# Patient Record
Sex: Female | Born: 1955 | Race: White | Hispanic: No | Marital: Married | Smoking: Current every day smoker
Health system: Southern US, Community
[De-identification: ages and names within clinical notes are randomized; demographics above are authoritative.]

## PROBLEM LIST (undated history)

## (undated) DIAGNOSIS — R059 Cough, unspecified: Secondary | ICD-10-CM

## (undated) DIAGNOSIS — F17200 Nicotine dependence, unspecified, uncomplicated: Secondary | ICD-10-CM

## (undated) DIAGNOSIS — R05 Cough: Secondary | ICD-10-CM

## (undated) DIAGNOSIS — J42 Unspecified chronic bronchitis: Secondary | ICD-10-CM

## (undated) HISTORY — DX: Cough: R05

## (undated) HISTORY — PX: ABDOMINAL HYSTERECTOMY: SHX81

## (undated) HISTORY — DX: Unspecified chronic bronchitis: J42

## (undated) HISTORY — DX: Cough, unspecified: R05.9

## (undated) HISTORY — DX: Nicotine dependence, unspecified, uncomplicated: F17.200

---

## 2002-02-13 HISTORY — PX: CARPAL TUNNEL RELEASE: SHX101

## 2004-02-14 HISTORY — PX: TENNIS ELBOW RELEASE/NIRSCHEL PROCEDURE: SHX6651

## 2009-10-27 ENCOUNTER — Encounter: Payer: Self-pay | Admitting: Otolaryngology

## 2009-11-13 ENCOUNTER — Encounter: Payer: Self-pay | Admitting: Otolaryngology

## 2017-12-20 ENCOUNTER — Ambulatory Visit: Payer: Self-pay | Admitting: Internal Medicine

## 2017-12-25 ENCOUNTER — Ambulatory Visit: Payer: BLUE CROSS/BLUE SHIELD | Admitting: Internal Medicine

## 2017-12-25 ENCOUNTER — Ambulatory Visit
Admission: RE | Admit: 2017-12-25 | Discharge: 2017-12-25 | Disposition: A | Discharge status: Home / Self Care | Payer: BLUE CROSS/BLUE SHIELD | Source: Ambulatory Visit | Attending: Internal Medicine | Admitting: Internal Medicine

## 2017-12-25 ENCOUNTER — Encounter: Payer: Self-pay | Admitting: Internal Medicine

## 2017-12-25 VITALS — BP 108/72 | HR 74 | Resp 16 | Ht 61.0 in | Wt 105.0 lb

## 2017-12-25 DIAGNOSIS — J301 Allergic rhinitis due to pollen: Secondary | ICD-10-CM

## 2017-12-25 DIAGNOSIS — F17200 Nicotine dependence, unspecified, uncomplicated: Secondary | ICD-10-CM | POA: Diagnosis not present

## 2017-12-25 DIAGNOSIS — J449 Chronic obstructive pulmonary disease, unspecified: Secondary | ICD-10-CM | POA: Insufficient documentation

## 2017-12-25 DIAGNOSIS — R059 Cough, unspecified: Secondary | ICD-10-CM

## 2017-12-25 DIAGNOSIS — R0602 Shortness of breath: Secondary | ICD-10-CM

## 2017-12-25 DIAGNOSIS — R05 Cough: Secondary | ICD-10-CM

## 2017-12-25 NOTE — Patient Instructions (Signed)

## 2017-12-25 NOTE — Progress Notes (Signed)
[facility] [address] [place], [place] [address]  Pulmonary Sleep Medicine   Office Visit Note  Patient Name: Alice Sanders DOB: 1955/10/04 MRN [id number]  Date of Service: 12/25/2017  Complaints/HPI: Patient is here as a new patient patient been having cough congestion.  Patient is a smoker and has been a lifelong smoker.  She states she gets shortness of breath on exertion.  She has some cough and congestion.  She brings up sputum no hemoptysis is noted.  Patient denies any chest pain no palpitations.  She states that she has never been diagnosed previously with a pneumonia.  States that she has had evaluation at her primary care with x-rays without any significant findings.  Patient has been placed on trilogy and this is helped her to some extent.  She also admits to having allergies lifelong.  States that she has had symptoms of rhinitis congestion.  She was tested in the past but never placed on allergy shots.  Also she states that she has not had any changes in those allergy symptoms.  She has been using Singulair which does help to some extent but does not completely fix her problem.  ROS  General: (-) fever, (-) chills, (-) night sweats, (-) weakness Skin: (-) rashes, (-) itching,. Eyes: (-) visual changes, (-) redness, (-) itching. Nose and Sinuses: (-) nasal stuffiness or itchiness, (-) postnasal drip, (-) nosebleeds, (-) sinus trouble. Mouth and Throat: (-) sore throat, (-) hoarseness. Neck: (-) swollen glands, (-) enlarged thyroid, (-) neck pain. Respiratory: + cough, (-) bloody sputum, + shortness of breath, + wheezing. Cardiovascular: - ankle swelling, (-) chest pain. Lymphatic: (-) lymph node enlargement. Neurologic: (-) numbness, (-) tingling. Psychiatric: (-) anxiety, (-) depression   Current Medication: Outpatient Encounter Medications as of 12/25/2017  Medication Sig  . aspirin EC 81 MG tablet Take 81 mg by mouth daily.  . ergocalciferol  (VITAMIN D2) 1.25 MG (50000 UT) capsule Take 50,000 Units by mouth once a week.  . estradiol-norethindrone (ACTIVELLA) 1-0.5 MG tablet   . Fluticasone-Umeclidin-Vilant (TRELEGY ELLIPTA) 100-62.5-25 MCG/INH AEPB Inhale into the lungs.  Marland Kitchen levothyroxine (SYNTHROID, LEVOTHROID) 25 MCG tablet Take 25 mcg by mouth daily before breakfast.  . metoprolol succinate (TOPROL-XL) 50 MG 24 hr tablet Take 50 mg by mouth daily. Take with or immediately following a meal.  . montelukast (SINGULAIR) 10 MG tablet Take 10 mg by mouth at bedtime.  . simvastatin (ZOCOR) 20 MG tablet Take 20 mg by mouth daily.  Terald Sleeper HFA 108 (90 Base) MCG/ACT inhaler    No facility-administered encounter medications on file as of 12/25/2017.     Surgical History: Past Surgical History:  Procedure Laterality Date  . ABDOMINAL HYSTERECTOMY    . CARPAL TUNNEL RELEASE  2004  . TENNIS ELBOW RELEASE/NIRSCHEL PROCEDURE  2006    Medical History: Past Medical History:  Diagnosis Date  . Chronic bronchitis (HCC)     Family History: Family History  Problem Relation Age of Onset  . Hypertension Mother   . Hyperlipidemia Mother   . Hypertension Father   . Hyperlipidemia Father   . Hypertension Brother   . Hyperlipidemia Brother     Social History: Social History   Socioeconomic History  . Marital status: Married    Spouse name: Not on file  . Number of children: Not on file  . Years of education: Not on file  . Highest education level: Not on file  Occupational History  . Not on file  Social Needs  . Financial resource strain: Not on file  . Food insecurity:    Worry: Not on file    Inability: Not on file  . Transportation needs:    Medical: Not on file    Non-medical: Not on file  Tobacco Use  . Smoking status: Current Some Day Smoker    Packs/day: 0.50    Types: Cigarettes  . Smokeless tobacco: Never Used  Substance and Sexual Activity  . Alcohol use: Never    Frequency: Never  . Drug use: Never  .  Sexual activity: Not on file  Lifestyle  . Physical activity:    Days per week: Not on file    Minutes per session: Not on file  . Stress: Not on file  Relationships  . Social connections:    Talks on phone: Not on file    Gets together: Not on file    Attends religious service: Not on file    Active member of club or organization: Not on file    Attends meetings of clubs or organizations: Not on file    Relationship status: Not on file  . Intimate partner violence:    Fear of current or ex partner: Not on file    Emotionally abused: Not on file    Physically abused: Not on file    Forced sexual activity: Not on file  Other Topics Concern  . Not on file  Social History Narrative  . Not on file    Vital Signs: Blood pressure 108/72, pulse 74, resp. rate 16, height 5\' 1"  (1.549 m), weight 105 lb (47.6 kg), SpO2 98 %.  Examination: General Appearance: The patient is well-developed, well-nourished, and in no distress. Skin: Gross inspection of skin unremarkable. Head: normocephalic, no gross deformities. Eyes: no gross deformities noted. ENT: ears appear grossly normal no exudates. Neck: Supple. No thyromegaly. No LAD. Respiratory: no rhonhi noted at this time. Cardiovascular: Normal S1 and S2 without murmur or rub. Extremities: No cyanosis. pulses are equal. Neurologic: Alert and oriented. No involuntary movements.  LABS: No results found for this or any previous visit (from the past 2160 hour(s)).  Radiology: No results found.  No results found.  No results found.    Assessment and Plan: Patient Active Problem List   Diagnosis Date Noted  . Cough   . Smoker     1. SOB/Cough likely evaluation of ongoing tobacco use as well as allergic rhinitis.  I would recommend at this time that she stop smoking so smoking cessation counseling was provided to her.  She also will be scheduled for evaluation of hospital COPD and so therefore we will schedule for a pulmonary  function study. 2. Allergic Rhinitis scheduled for allergy test I think she would benefit from allergy shots.  She has had allergies to pollen in the past she states 3. Smoker smoking cessation counseling was provided at this time.  Patient understands the risks are involved however she feels like she is not ready to quit smoking at this time. 4. Shortness of breath as above likely combination of cigarette smoking COPD.  Once again smoking cessation counseling was provided to patient.   General Counseling: I have discussed the findings of the evaluation and examination with Velna Hatchet.  I have also discussed any further diagnostic evaluation thatmay be needed or ordered today. Kassiah verbalizes understanding of the findings of todays visit. We also reviewed her medications today and discussed drug interactions and side effects including but not limited excessive  drowsiness and altered mental states. We also discussed that there is always a risk not just to her but also people around her. she has been encouraged to call the office with any questions or concerns that should arise related to todays visit.    Time spent: 45min  I have personally obtained a history, examined the patient, evaluated laboratory and imaging results, formulated the assessment and plan and placed orders.    Yevonne PaxSaadat A , MD [facility] Pulmonary and Critical Care Sleep medicine

## 2018-01-09 ENCOUNTER — Ambulatory Visit: Payer: BLUE CROSS/BLUE SHIELD | Admitting: Internal Medicine

## 2018-01-09 DIAGNOSIS — R0602 Shortness of breath: Secondary | ICD-10-CM

## 2018-01-09 LAB — PULMONARY FUNCTION TEST

## 2018-01-12 NOTE — Procedures (Signed)
[facility] [address] [place], [id number]  DATE OF SERVICE: January 09, 2018  Complete Pulmonary Function Testing Interpretation:  FINDINGS:  Forced vital capacity is normal the FEV1 is 1.53 L which is 71% of predicted and is mildly decreased.  FEV1 FVC ratio is moderately decreased.  Postbronchodilator there is no significant change in the FEV1 however clinical improvement may occur in the absence of spirometric improvement.  Total lung capacity is increased by plethysmography.  Residual volume is increased residual volume total lung capacity ratio is increased FRC is increased.  DLCO is severely decreased.  IMPRESSION:  This pulmonary function study is consistent with mild obstructive lung disease.  No improvement after bronchodilator.  DLCO severely decreased  Yevonne PaxSaadat A Khan, MD [facility] Pulmonary Critical Care Medicine Sleep Medicine

## 2018-01-22 ENCOUNTER — Ambulatory Visit: Payer: BLUE CROSS/BLUE SHIELD | Admitting: Internal Medicine

## 2018-01-22 ENCOUNTER — Encounter: Payer: Self-pay | Admitting: Internal Medicine

## 2018-01-22 VITALS — BP 98/62 | HR 75 | Resp 16 | Ht 61.0 in | Wt 108.0 lb

## 2018-01-22 DIAGNOSIS — F17219 Nicotine dependence, cigarettes, with unspecified nicotine-induced disorders: Secondary | ICD-10-CM | POA: Diagnosis not present

## 2018-01-22 DIAGNOSIS — J449 Chronic obstructive pulmonary disease, unspecified: Secondary | ICD-10-CM

## 2018-01-22 DIAGNOSIS — R059 Cough, unspecified: Secondary | ICD-10-CM

## 2018-01-22 DIAGNOSIS — R05 Cough: Secondary | ICD-10-CM | POA: Diagnosis not present

## 2018-01-22 DIAGNOSIS — R0602 Shortness of breath: Secondary | ICD-10-CM | POA: Diagnosis not present

## 2018-01-22 NOTE — Patient Instructions (Signed)

## 2018-01-22 NOTE — Progress Notes (Signed)
[facility] [address] [place], [place] [address]  Pulmonary Sleep Medicine   Office Visit Note  Patient Name: Alice Sanders DOB: Jul 31, 1955 MRN [id number]  Date of Service: 01/22/2018  Complaints/HPI: Pt is here for follow up on PFT and CXR.  Patient's PFT reveals an FEV1 of 1.53 L.  Which is consistent with mild obstructive lung disease.  Patient generally is doing well and denies any overt shortness of breath.  Unfortunately she does continue to smoke approximately a half a pack of cigarettes per day.  She denies any other issues currently.  ROS  General: (-) fever, (-) chills, (-) night sweats, (-) weakness Skin: (-) rashes, (-) itching,. Eyes: (-) visual changes, (-) redness, (-) itching. Nose and Sinuses: (-) nasal stuffiness or itchiness, (-) postnasal drip, (-) nosebleeds, (-) sinus trouble. Mouth and Throat: (-) sore throat, (-) hoarseness. Neck: (-) swollen glands, (-) enlarged thyroid, (-) neck pain. Respiratory: - cough, (-) bloody sputum, - shortness of breath, - wheezing. Cardiovascular: - ankle swelling, (-) chest pain. Lymphatic: (-) lymph node enlargement. Neurologic: (-) numbness, (-) tingling. Psychiatric: (-) anxiety, (-) depression   Current Medication: Outpatient Encounter Medications as of 01/22/2018  Medication Sig  . aspirin EC 81 MG tablet Take 81 mg by mouth daily.  . ergocalciferol (VITAMIN D2) 1.25 MG (50000 UT) capsule Take 50,000 Units by mouth once a week.  . estradiol-norethindrone (ACTIVELLA) 1-0.5 MG tablet   . Fluticasone-Umeclidin-Vilant (TRELEGY ELLIPTA) 100-62.5-25 MCG/INH AEPB Inhale into the lungs.  Marland Kitchen levothyroxine (SYNTHROID, LEVOTHROID) 25 MCG tablet Take 25 mcg by mouth daily before breakfast.  . metoprolol succinate (TOPROL-XL) 50 MG 24 hr tablet Take 50 mg by mouth daily. Take with or immediately following a meal.  . montelukast (SINGULAIR) 10 MG tablet Take 10 mg by mouth at bedtime.  . simvastatin (ZOCOR) 20 MG  tablet Take 20 mg by mouth daily.  Terald Sleeper HFA 108 (90 Base) MCG/ACT inhaler    No facility-administered encounter medications on file as of 01/22/2018.     Surgical History: Past Surgical History:  Procedure Laterality Date  . ABDOMINAL HYSTERECTOMY    . CARPAL TUNNEL RELEASE  2004  . TENNIS ELBOW RELEASE/NIRSCHEL PROCEDURE  2006    Medical History: Past Medical History:  Diagnosis Date  . Chronic bronchitis (HCC)   . Cough   . Smoker     Family History: Family History  Problem Relation Age of Onset  . Hypertension Mother   . Hyperlipidemia Mother   . Hypertension Father   . Hyperlipidemia Father   . Hypertension Brother   . Hyperlipidemia Brother     Social History: Social History   Socioeconomic History  . Marital status: Married    Spouse name: Not on file  . Number of children: Not on file  . Years of education: Not on file  . Highest education level: Not on file  Occupational History  . Not on file  Social Needs  . Financial resource strain: Not on file  . Food insecurity:    Worry: Not on file    Inability: Not on file  . Transportation needs:    Medical: Not on file    Non-medical: Not on file  Tobacco Use  . Smoking status: Current Some Day Smoker    Packs/day: 0.50    Types: Cigarettes  . Smokeless tobacco: Never Used  Substance and Sexual Activity  . Alcohol use: Never    Frequency: Never  . Drug use: Never  .  Sexual activity: Not on file  Lifestyle  . Physical activity:    Days per week: Not on file    Minutes per session: Not on file  . Stress: Not on file  Relationships  . Social connections:    Talks on phone: Not on file    Gets together: Not on file    Attends religious service: Not on file    Active member of club or organization: Not on file    Attends meetings of clubs or organizations: Not on file    Relationship status: Not on file  . Intimate partner violence:    Fear of current or ex partner: Not on file     Emotionally abused: Not on file    Physically abused: Not on file    Forced sexual activity: Not on file  Other Topics Concern  . Not on file  Social History Narrative  . Not on file    Vital Signs: Blood pressure 98/62, pulse 75, resp. rate 16, height 5\' 1"  (1.549 m), weight 108 lb (49 kg), SpO2 98 %.  Examination: General Appearance: The patient is well-developed, well-nourished, and in no distress. Skin: Gross inspection of skin unremarkable. Head: normocephalic, no gross deformities. Eyes: no gross deformities noted. ENT: ears appear grossly normal no exudates. Neck: Supple. No thyromegaly. No LAD. Respiratory: No rhonchi or rales are noted at this time. Cardiovascular: Normal S1 and S2 without murmur or rub. Extremities: No cyanosis. pulses are equal. Neurologic: Alert and oriented. No involuntary movements.  LABS: No results found for this or any previous visit (from the past 2160 hour(s)).  Radiology: Dg Chest 2 View  Result Date: 12/26/2017 CLINICAL DATA:  Cough and shortness of Breath EXAM: CHEST - 2 VIEW COMPARISON:  None. FINDINGS: Cardiac shadows within normal limits. The lungs are hyperinflated consistent with COPD. Some minimal streaky density is noted in the left lower lobe. This could represent some early infiltrate and/or atelectasis. No bony abnormality is noted. IMPRESSION: COPD with mild streaky opacity in the left lower lobe posteriorly. This may represent some early infiltrate. Electronically Signed   By: Alcide CleverMark  Lukens M.D.   On: 12/26/2017 08:20    No results found.  Dg Chest 2 View  Result Date: 12/26/2017 CLINICAL DATA:  Cough and shortness of Breath EXAM: CHEST - 2 VIEW COMPARISON:  None. FINDINGS: Cardiac shadows within normal limits. The lungs are hyperinflated consistent with COPD. Some minimal streaky density is noted in the left lower lobe. This could represent some early infiltrate and/or atelectasis. No bony abnormality is noted. IMPRESSION: COPD  with mild streaky opacity in the left lower lobe posteriorly. This may represent some early infiltrate. Electronically Signed   By: Alcide CleverMark  Lukens M.D.   On: 12/26/2017 08:20      Assessment and Plan: Patient Active Problem List   Diagnosis Date Noted  . Cough   . Smoker    1. Chronic obstructive pulmonary disease, unspecified COPD type (HCC) Stable continue current management.  2. Cigarette nicotine dependence with nicotine-induced disorder Smoking cessation counseling: 1. Pt acknowledges the risks of long term smoking, she will try to quite smoking. 2. Options for different medications including nicotine products, chewing gum, patch etc, Wellbutrin and Chantix is discussed 3. Goal and date of compete cessation is discussed 4. Total time spent in smoking cessation is 15 min.  3. SOB (shortness of breath) Stable at this time  4. Cough Cough appears to have resolved.  Patient denies any other complaints.  General Counseling: I have discussed the findings of the evaluation and examination with Velna Hatchet.  I have also discussed any further diagnostic evaluation thatmay be needed or ordered today. Mallory verbalizes understanding of the findings of todays visit. We also reviewed her medications today and discussed drug interactions and side effects including but not limited excessive drowsiness and altered mental states. We also discussed that there is always a risk not just to her but also people around her. she has been encouraged to call the office with any questions or concerns that should arise related to todays visit.    Time spent: 25 This patient was seen by Blima Ledger AGNP-C in Collaboration with Dr. Freda Munro as a part of collaborative care agreement.   I have personally obtained a history, examined the patient, evaluated laboratory and imaging results, formulated the assessment and plan and placed orders.    Yevonne Pax, MD [facility] Pulmonary and Critical Care Sleep  medicine

## 2018-04-30 ENCOUNTER — Encounter: Payer: Self-pay | Admitting: Internal Medicine

## 2018-04-30 ENCOUNTER — Ambulatory Visit: Payer: BLUE CROSS/BLUE SHIELD | Admitting: Internal Medicine

## 2018-04-30 VITALS — BP 108/72 | HR 66 | Resp 16 | Ht 61.0 in | Wt 102.0 lb

## 2018-04-30 DIAGNOSIS — J449 Chronic obstructive pulmonary disease, unspecified: Secondary | ICD-10-CM

## 2018-04-30 DIAGNOSIS — J301 Allergic rhinitis due to pollen: Secondary | ICD-10-CM | POA: Diagnosis not present

## 2018-04-30 DIAGNOSIS — F17219 Nicotine dependence, cigarettes, with unspecified nicotine-induced disorders: Secondary | ICD-10-CM | POA: Diagnosis not present

## 2018-04-30 DIAGNOSIS — R0602 Shortness of breath: Secondary | ICD-10-CM

## 2018-04-30 MED ORDER — BENZONATATE 100 MG PO CAPS: 100.0000 mg | ORAL_CAPSULE | Freq: Two times a day (BID) | ORAL | 0 refills | Status: DC | PRN

## 2018-04-30 NOTE — Progress Notes (Signed)
[facility] [address] [place], [place] [address]  Pulmonary Sleep Medicine   Office Visit Note  Patient Name: Alice Sanders DOB: 22-Dec-1955 MRN [id number]  Date of Service: 04/30/2018  Complaints/HPI: Pt is here for follow up on copd, Asthma, and allergies. Overall she reports she is doing well.  Denies any new symptoms.  She continues to have chronic cough.  unfortunately she continues to smoke cigarettes.  She reports he cough was very bad two nights ago and she found some old Benzonatate that she was previously prescribed.  She took one, and it helped tremendously with her cough.  She is requesting a refill on that medication at this time.      ROS  General: (-) fever, (-) chills, (-) night sweats, (-) weakness Skin: (-) rashes, (-) itching,. Eyes: (-) visual changes, (-) redness, (-) itching. Nose and Sinuses: (-) nasal stuffiness or itchiness, (-) postnasal drip, (-) nosebleeds, (-) sinus trouble. Mouth and Throat: (-) sore throat, (-) hoarseness. Neck: (-) swollen glands, (-) enlarged thyroid, (-) neck pain. Respiratory: - cough, (-) bloody sputum, - shortness of breath, - wheezing. Cardiovascular: - ankle swelling, (-) chest pain. Lymphatic: (-) lymph node enlargement. Neurologic: (-) numbness, (-) tingling. Psychiatric: (-) anxiety, (-) depression   Current Medication: Outpatient Encounter Medications as of 04/30/2018  Medication Sig  . aspirin EC 81 MG tablet Take 81 mg by mouth daily.  . ergocalciferol (VITAMIN D2) 1.25 MG (50000 UT) capsule Take 50,000 Units by mouth once a week.  . estradiol-norethindrone (ACTIVELLA) 1-0.5 MG tablet   . Fluticasone-Umeclidin-Vilant (TRELEGY ELLIPTA) 100-62.5-25 MCG/INH AEPB Inhale into the lungs.  Marland Kitchen levothyroxine (SYNTHROID, LEVOTHROID) 25 MCG tablet Take 25 mcg by mouth daily before breakfast.  . metoprolol succinate (TOPROL-XL) 50 MG 24 hr tablet Take 50 mg by mouth daily. Take with or immediately following a meal.   . montelukast (SINGULAIR) 10 MG tablet Take 10 mg by mouth at bedtime.  . simvastatin (ZOCOR) 20 MG tablet Take 20 mg by mouth daily.  Terald Sleeper HFA 108 (90 Base) MCG/ACT inhaler    No facility-administered encounter medications on file as of 04/30/2018.     Surgical History: Past Surgical History:  Procedure Laterality Date  . ABDOMINAL HYSTERECTOMY    . CARPAL TUNNEL RELEASE  2004  . TENNIS ELBOW RELEASE/NIRSCHEL PROCEDURE  2006    Medical History: Past Medical History:  Diagnosis Date  . Chronic bronchitis (HCC)   . Cough   . Smoker     Family History: Family History  Problem Relation Age of Onset  . Hypertension Mother   . Hyperlipidemia Mother   . Hypertension Father   . Hyperlipidemia Father   . Hypertension Brother   . Hyperlipidemia Brother     Social History: Social History   Socioeconomic History  . Marital status: Married    Spouse name: Not on file  . Number of children: Not on file  . Years of education: Not on file  . Highest education level: Not on file  Occupational History  . Not on file  Social Needs  . Financial resource strain: Not on file  . Food insecurity:    Worry: Not on file    Inability: Not on file  . Transportation needs:    Medical: Not on file    Non-medical: Not on file  Tobacco Use  . Smoking status: Current Some Day Smoker    Packs/day: 0.50    Types: Cigarettes  . Smokeless tobacco: Never Used  Substance and Sexual Activity  . Alcohol use: Never    Frequency: Never  . Drug use: Never  . Sexual activity: Not on file  Lifestyle  . Physical activity:    Days per week: Not on file    Minutes per session: Not on file  . Stress: Not on file  Relationships  . Social connections:    Talks on phone: Not on file    Gets together: Not on file    Attends religious service: Not on file    Active member of club or organization: Not on file    Attends meetings of clubs or organizations: Not on file    Relationship  status: Not on file  . Intimate partner violence:    Fear of current or ex partner: Not on file    Emotionally abused: Not on file    Physically abused: Not on file    Forced sexual activity: Not on file  Other Topics Concern  . Not on file  Social History Narrative  . Not on file    Vital Signs: Blood pressure 108/72, pulse 66, resp. rate 16, height 5\' 1"  (1.549 m), weight 102 lb (46.3 kg), SpO2 96 %.  Examination: General Appearance: The patient is well-developed, well-nourished, and in no distress. Skin: Gross inspection of skin unremarkable. Head: normocephalic, no gross deformities. Eyes: no gross deformities noted. ENT: ears appear grossly normal no exudates. Neck: Supple. No thyromegaly. No LAD. Respiratory: clear bilateraly. Cardiovascular: Normal S1 and S2 without murmur or rub. Extremities: No cyanosis. pulses are equal. Neurologic: Alert and oriented. No involuntary movements.  LABS: No results found for this or any previous visit (from the past 2160 hour(s)).  Radiology: Dg Chest 2 View  Result Date: 12/26/2017 CLINICAL DATA:  Cough and shortness of Breath EXAM: CHEST - 2 VIEW COMPARISON:  None. FINDINGS: Cardiac shadows within normal limits. The lungs are hyperinflated consistent with COPD. Some minimal streaky density is noted in the left lower lobe. This could represent some early infiltrate and/or atelectasis. No bony abnormality is noted. IMPRESSION: COPD with mild streaky opacity in the left lower lobe posteriorly. This may represent some early infiltrate. Electronically Signed   By: Alcide Clever M.D.   On: 12/26/2017 08:20    No results found.  No results found.    Assessment and Plan: Patient Active Problem List   Diagnosis Date Noted  . Cough   . Smoker     1. Chronic obstructive pulmonary disease, unspecified COPD type (HCC) Stable, continue to use inhalers as prescribed.    2. Cigarette nicotine dependence with nicotine-induced  disorder Smoking cessation counseling: 1. Pt acknowledges the risks of long term smoking, she will try to quite smoking. 2. Options for different medications including nicotine products, chewing gum, patch etc, Wellbutrin and Chantix is discussed 3. Goal and date of compete cessation is discussed 4. Total time spent in smoking cessation is 15 min.   3. Seasonal allergic rhinitis due to pollen PT uses multiple homeopathic medicines   4. SOB (shortness of breath) FVC is 2.3 which is 78% of the predicted value FEV1 is 1.2 which is 52% of the pre-predicted value FEV1/FVC is 51% which is 66% of the pre-predicted value on today spirometry. - Spirometry with Graph  General Counseling: I have discussed the findings of the evaluation and examination with Velna Hatchet.  I have also discussed any further diagnostic evaluation thatmay be needed or ordered today. Sharonda verbalizes understanding of the findings of todays visit. We  also reviewed her medications today and discussed drug interactions and side effects including but not limited excessive drowsiness and altered mental states. We also discussed that there is always a risk not just to her but also people around her. she has been encouraged to call the office with any questions or concerns that should arise related to todays visit.    Time spent: 25 This patient was seen by Blima Ledger AGNP-C in Collaboration with Dr. Freda Munro as a part of collaborative care agreement.   I have personally obtained a history, examined the patient, evaluated laboratory and imaging results, formulated the assessment and plan and placed orders.    Yevonne Pax, MD [facility] Pulmonary and Critical Care Sleep medicine

## 2018-09-26 ENCOUNTER — Ambulatory Visit: Payer: BC Managed Care – PPO | Admitting: Internal Medicine

## 2018-09-26 ENCOUNTER — Encounter: Payer: Self-pay | Admitting: Internal Medicine

## 2018-09-26 ENCOUNTER — Other Ambulatory Visit: Payer: Self-pay

## 2018-09-26 VITALS — BP 98/68 | HR 68 | Resp 16 | Ht 61.0 in | Wt 103.0 lb

## 2018-09-26 DIAGNOSIS — R0602 Shortness of breath: Secondary | ICD-10-CM

## 2018-09-26 DIAGNOSIS — R05 Cough: Secondary | ICD-10-CM | POA: Diagnosis not present

## 2018-09-26 DIAGNOSIS — R059 Cough, unspecified: Secondary | ICD-10-CM

## 2018-09-26 DIAGNOSIS — J301 Allergic rhinitis due to pollen: Secondary | ICD-10-CM

## 2018-09-26 DIAGNOSIS — F17219 Nicotine dependence, cigarettes, with unspecified nicotine-induced disorders: Secondary | ICD-10-CM

## 2018-09-26 DIAGNOSIS — J449 Chronic obstructive pulmonary disease, unspecified: Secondary | ICD-10-CM

## 2018-09-26 NOTE — Progress Notes (Signed)
[facility] [place], [place] [address]  Pulmonary Sleep Medicine   Office Visit Note  Patient Name: Alice Sanders DOB: [birth year] MRN [id number]  Date of Service: 09/26/2018  Complaints/HPI: Pt is here for follow up on copd, asthma and allergies.  She reports she is doing well. She reports she has been using benzonatate for cough as needed and reports excellent results.  Unfortunately she continues to smoke 1/2 PPD. She denies any new or worsening sympotms.  She is working in a Special educational needs teacher.  She is using trelegy inhaler with good results.  She has a rescue inhaler ventolin, but has not needed it often.      ROS  General: (-) fever, (-) chills, (-) night sweats, (-) weakness Skin: (-) rashes, (-) itching,. Eyes: (-) visual changes, (-) redness, (-) itching. Nose and Sinuses: (-) nasal stuffiness or itchiness, (-) postnasal drip, (-) nosebleeds, (-) sinus trouble. Mouth and Throat: (-) sore throat, (-) hoarseness. Neck: (-) swollen glands, (-) enlarged thyroid, (-) neck pain. Respiratory: - cough, (-) bloody sputum, - shortness of breath, - wheezing. Cardiovascular: - ankle swelling, (-) chest pain. Lymphatic: (-) lymph node enlargement. Neurologic: (-) numbness, (-) tingling. Psychiatric: (-) anxiety, (-) depression   Current Medication: Outpatient Encounter Medications as of 09/26/2018  Medication Sig  . aspirin EC 81 MG tablet Take 81 mg by mouth daily.  . benzonatate (TESSALON) 100 MG capsule Take 1-2 capsules (100-200 mg total) by mouth 2 (two) times daily as needed for cough.  . ergocalciferol (VITAMIN D2) 1.25 MG (50000 UT) capsule Take 50,000 Units by mouth once a week.  . estradiol-norethindrone (ACTIVELLA) 1-0.5 MG tablet   . Fluticasone-Umeclidin-Vilant (TRELEGY ELLIPTA) 100-62.5-25 MCG/INH AEPB Inhale into the lungs.  Marland Kitchen levothyroxine (SYNTHROID, LEVOTHROID) 25 MCG tablet Take 25 mcg by mouth daily before breakfast.  . metoprolol succinate  (TOPROL-XL) 50 MG 24 hr tablet Take 50 mg by mouth daily. Take with or immediately following a meal.  . montelukast (SINGULAIR) 10 MG tablet Take 10 mg by mouth at bedtime.  . simvastatin (ZOCOR) 20 MG tablet Take 20 mg by mouth daily.  Enid Cutter HFA 108 (90 Base) MCG/ACT inhaler    No facility-administered encounter medications on file as of 09/26/2018.     Surgical History: Past Surgical History:  Procedure Laterality Date  . ABDOMINAL HYSTERECTOMY    . CARPAL TUNNEL RELEASE  2004  . TENNIS ELBOW RELEASE/NIRSCHEL PROCEDURE  2006    Medical History: Past Medical History:  Diagnosis Date  . Chronic bronchitis ([place])   . Cough   . Smoker     Family History: Family History  Problem Relation Age of Onset  . Hypertension Mother   . Hyperlipidemia Mother   . Hypertension Father   . Hyperlipidemia Father   . Hypertension Brother   . Hyperlipidemia Brother     Social History: Social History   Socioeconomic History  . Marital status: Married    Spouse name: Not on file  . Number of children: Not on file  . Years of education: Not on file  . Highest education level: Not on file  Occupational History  . Not on file  Social Needs  . Financial resource strain: Not on file  . Food insecurity    Worry: Not on file    Inability: Not on file  . Transportation needs    Medical: Not on file    Non-medical: Not on file  Tobacco Use  . Smoking status: Current  Some Day Smoker    Packs/day: 0.50    Types: Cigarettes  . Smokeless tobacco: Never Used  Substance and Sexual Activity  . Alcohol use: Never    Frequency: Never  . Drug use: Never  . Sexual activity: Not on file  Lifestyle  . Physical activity    Days per week: Not on file    Minutes per session: Not on file  . Stress: Not on file  Relationships  . Social Musicianconnections    Talks on phone: Not on file    Gets together: Not on file    Attends religious service: Not on file    Active member of club or  organization: Not on file    Attends meetings of clubs or organizations: Not on file    Relationship status: Not on file  . Intimate partner violence    Fear of current or ex partner: Not on file    Emotionally abused: Not on file    Physically abused: Not on file    Forced sexual activity: Not on file  Other Topics Concern  . Not on file  Social History Narrative  . Not on file    Vital Signs: Blood pressure 98/68, pulse 68, resp. rate 16, height 5\' 1"  (1.549 m), weight 103 lb (46.7 kg), SpO2 98 %.  Examination: General Appearance: The patient is well-developed, well-nourished, and in no distress. Skin: Gross inspection of skin unremarkable. Head: normocephalic, no gross deformities. Eyes: no gross deformities noted. ENT: ears appear grossly normal no exudates. Neck: Supple. No thyromegaly. No LAD. Respiratory: clear bilateraly. Cardiovascular: Normal S1 and S2 without murmur or rub. Extremities: No cyanosis. pulses are equal. Neurologic: Alert and oriented. No involuntary movements.  LABS: No results found for this or any previous visit (from the past 2160 hour(s)).  Radiology: Dg Chest 2 View  Result Date: 12/26/2017 CLINICAL DATA:  Cough and shortness of Breath EXAM: CHEST - 2 VIEW COMPARISON:  None. FINDINGS: Cardiac shadows within normal limits. The lungs are hyperinflated consistent with COPD. Some minimal streaky density is noted in the left lower lobe. This could represent some early infiltrate and/or atelectasis. No bony abnormality is noted. IMPRESSION: COPD with mild streaky opacity in the left lower lobe posteriorly. This may represent some early infiltrate. Electronically Signed   By: Alcide CleverMark  Lukens M.D.   On: 12/26/2017 08:20    No results found.  No results found.    Assessment and Plan: Patient Active Problem List   Diagnosis Date Noted  . Cough   . Smoker     1. Chronic obstructive pulmonary disease, unspecified COPD type (HCC) Stable, continue  present management.  Continue to use trelegy daily.    2. Cough Continue to use benzonatate as needed.   3. Seasonal allergic rhinitis due to pollen Stable, continue using singulair as needed.   4. Cigarette nicotine dependence with nicotine-induced disorder Smoking cessation counseling: 1. Pt acknowledges the risks of long term smoking, she will try to quite smoking. 2. Options for different medications including nicotine products, chewing gum, patch etc, Wellbutrin and Chantix is discussed 3. Goal and date of compete cessation is discussed 4. Total time spent in smoking cessation is 15 min.   5. SOB (shortness of breath) FEV 1 stable 1.1 which is 49% of pre-predicted value.  - Spirometry with Graph  General Counseling: I have discussed the findings of the evaluation and examination with Velna HatchetSheila.  I have also discussed any further diagnostic evaluation thatmay be needed or  ordered today. Velna HatchetSheila verbalizes understanding of the findings of todays visit. We also reviewed her medications today and discussed drug interactions and side effects including but not limited excessive drowsiness and altered mental states. We also discussed that there is always a risk not just to her but also people around her. she has been encouraged to call the office with any questions or concerns that should arise related to todays visit.    Time spent: 15 This patient was seen by Blima LedgerAdam Lalana Wachter AGNP-C in Collaboration with Dr. Freda MunroSaadat Khan as a part of collaborative care agreement.   I have personally obtained a history, examined the patient, evaluated laboratory and imaging results, formulated the assessment and plan and placed orders.    Yevonne PaxSaadat A Khan, MD [facility] Pulmonary and Critical Care Sleep medicine

## 2019-01-30 ENCOUNTER — Ambulatory Visit: Payer: BC Managed Care – PPO | Admitting: Internal Medicine

## 2019-05-19 ENCOUNTER — Telehealth: Payer: Self-pay

## 2019-05-19 NOTE — Telephone Encounter (Signed)
Confirmed appointment on 05/20/2019 and screened for covid. klh 

## 2019-05-20 ENCOUNTER — Encounter: Payer: Self-pay | Admitting: Adult Health

## 2019-05-20 ENCOUNTER — Ambulatory Visit (INDEPENDENT_AMBULATORY_CARE_PROVIDER_SITE_OTHER): Payer: BC Managed Care – PPO | Admitting: Adult Health

## 2019-05-20 ENCOUNTER — Other Ambulatory Visit: Payer: Self-pay

## 2019-05-20 VITALS — BP 102/28 | HR 79 | Temp 97.4°F | Resp 16 | Ht 61.0 in | Wt 103.6 lb

## 2019-05-20 DIAGNOSIS — J449 Chronic obstructive pulmonary disease, unspecified: Secondary | ICD-10-CM

## 2019-05-20 DIAGNOSIS — J301 Allergic rhinitis due to pollen: Secondary | ICD-10-CM

## 2019-05-20 DIAGNOSIS — J4520 Mild intermittent asthma, uncomplicated: Secondary | ICD-10-CM

## 2019-05-20 DIAGNOSIS — R05 Cough: Secondary | ICD-10-CM

## 2019-05-20 DIAGNOSIS — R059 Cough, unspecified: Secondary | ICD-10-CM

## 2019-05-20 DIAGNOSIS — F17219 Nicotine dependence, cigarettes, with unspecified nicotine-induced disorders: Secondary | ICD-10-CM

## 2019-05-20 MED ORDER — BENZONATATE 100 MG PO CAPS: 100.0000 mg | ORAL_CAPSULE | Freq: Two times a day (BID) | ORAL | 0 refills | Status: DC | PRN

## 2019-05-20 NOTE — Progress Notes (Signed)
[facility] [address], [place] [address]  Pulmonary Sleep Medicine   Office Visit Note  Patient Name: Alice Sanders DOB: April 04, 1955 MRN [id number]  Date of Service: 05/20/2019  Complaints/HPI: Pt is here for pulmonary follow up.  She reports overall she is doing well. She has a histoyr of COPD, Asthma  And allergies.  She is currently using Trelegy daily, and has a rescue ventolin inhaler.  She has been using it more over the last week, which she contributes to the pollen.  She does not currently use RX meds for allergies.  She uses a homeopathic OTC supplement and has been having great results.    ROS  General: (-) fever, (-) chills, (-) night sweats, (-) weakness Skin: (-) rashes, (-) itching,. Eyes: (-) visual changes, (-) redness, (-) itching. Nose and Sinuses: (-) nasal stuffiness or itchiness, (-) postnasal drip, (-) nosebleeds, (-) sinus trouble. Mouth and Throat: (-) sore throat, (-) hoarseness. Neck: (-) swollen glands, (-) enlarged thyroid, (-) neck pain. Respiratory: - cough, (-) bloody sputum, - shortness of breath, - wheezing. Cardiovascular: - ankle swelling, (-) chest pain. Lymphatic: (-) lymph node enlargement. Neurologic: (-) numbness, (-) tingling. Psychiatric: (-) anxiety, (-) depression   Current Medication: Outpatient Encounter Medications as of 05/20/2019  Medication Sig  . aspirin EC 81 MG tablet Take 81 mg by mouth daily.  . benzonatate (TESSALON) 100 MG capsule Take 1-2 capsules (100-200 mg total) by mouth 2 (two) times daily as needed for cough.  . ergocalciferol (VITAMIN D2) 1.25 MG (50000 UT) capsule Take 50,000 Units by mouth once a week.  . estradiol-norethindrone (ACTIVELLA) 1-0.5 MG tablet   . Fluticasone-Umeclidin-Vilant (TRELEGY ELLIPTA) 100-62.5-25 MCG/INH AEPB Inhale into the lungs.  Marland Kitchen levothyroxine (SYNTHROID, LEVOTHROID) 25 MCG tablet Take 25 mcg by mouth daily before breakfast.  . metoprolol succinate (TOPROL-XL) 50  MG 24 hr tablet Take 50 mg by mouth daily. Take with or immediately following a meal.  . simvastatin (ZOCOR) 20 MG tablet Take 20 mg by mouth daily.  . VENTOLIN HFA 108 (90 Base) MCG/ACT inhaler   . montelukast (SINGULAIR) 10 MG tablet Take 10 mg by mouth at bedtime.   No facility-administered encounter medications on file as of 05/20/2019.    Surgical History: Past Surgical History:  Procedure Laterality Date  . ABDOMINAL HYSTERECTOMY    . CARPAL TUNNEL RELEASE  2004  . TENNIS ELBOW RELEASE/NIRSCHEL PROCEDURE  2006    Medical History: Past Medical History:  Diagnosis Date  . Chronic bronchitis ([place])   . Cough   . Smoker     Family History: Family History  Problem Relation Age of Onset  . Hypertension Mother   . Hyperlipidemia Mother   . Hypertension Father   . Hyperlipidemia Father   . Hypertension Brother   . Hyperlipidemia Brother     Social History: Social History   Socioeconomic History  . Marital status: Married    Spouse name: Not on file  . Number of children: Not on file  . Years of education: Not on file  . Highest education level: Not on file  Occupational History  . Not on file  Tobacco Use  . Smoking status: Current Some Day Smoker    Packs/day: 0.50    Types: Cigarettes  . Smokeless tobacco: Never Used  Substance and Sexual Activity  . Alcohol use: Never  . Drug use: Never  . Sexual activity: Not on file  Other Topics Concern  . Not on file  Social History Narrative  . Not on file   Social Determinants of Health   Financial Resource Strain:   . Difficulty of Paying Living Expenses:   Food Insecurity:   . Worried About Programme researcher, broadcasting/film/video in the Last Year:   . Barista in the Last Year:   Transportation Needs:   . Freight forwarder (Medical):   Marland Kitchen Lack of Transportation (Non-Medical):   Physical Activity:   . Days of Exercise per Week:   . Minutes of Exercise per Session:   Stress:   . Feeling of Stress :   Social  Connections:   . Frequency of Communication with Friends and Family:   . Frequency of Social Gatherings with Friends and Family:   . Attends Religious Services:   . Active Member of Clubs or Organizations:   . Attends Banker Meetings:   Marland Kitchen Marital Status:   Intimate Partner Violence:   . Fear of Current or Ex-Partner:   . Emotionally Abused:   Marland Kitchen Physically Abused:   . Sexually Abused:     Vital Signs: Blood pressure (!) 102/28, pulse 79, temperature (!) 97.4 F (36.3 C), resp. rate 16, height 5\' 1"  (1.549 m), weight 103 lb 9.6 oz (47 kg), SpO2 97 %.  Examination: General Appearance: The patient is well-developed, well-nourished, and in no distress. Skin: Gross inspection of skin unremarkable. Head: normocephalic, no gross deformities. Eyes: no gross deformities noted. ENT: ears appear grossly normal no exudates. Neck: Supple. No thyromegaly. No LAD. Respiratory: clear bilaterally. Cardiovascular: Normal S1 and S2 without murmur or rub. Extremities: No cyanosis. pulses are equal. Neurologic: Alert and oriented. No involuntary movements.  LABS: No results found for this or any previous visit (from the past 2160 hour(s)).  Radiology: DG Chest 2 View  Result Date: 12/26/2017 CLINICAL DATA:  Cough and shortness of Breath EXAM: CHEST - 2 VIEW COMPARISON:  None. FINDINGS: Cardiac shadows within normal limits. The lungs are hyperinflated consistent with COPD. Some minimal streaky density is noted in the left lower lobe. This could represent some early infiltrate and/or atelectasis. No bony abnormality is noted. IMPRESSION: COPD with mild streaky opacity in the left lower lobe posteriorly. This may represent some early infiltrate. Electronically Signed   By: 12/28/2017 M.D.   On: 12/26/2017 08:20    No results found.  No results found.    Assessment and Plan: Patient Active Problem List   Diagnosis Date Noted  . Cough   . Smoker     1. Chronic obstructive  pulmonary disease, unspecified COPD type (HCC) Continue trelegy as before.  Good relief of symptoms.   2. Mild intermittent asthma without complication Stable, continue to use ventolin as directed.   3. Seasonal allergic rhinitis due to pollen Continue supplement, and use tessalon and ventolin as needed.   4. Cough Continue tessalon as needed.   5. Cigarette nicotine dependence with nicotine-induced disorder Smoking cessation counseling: 1. Pt acknowledges the risks of long term smoking, she will try to quite smoking. 2. Options for different medications including nicotine products, chewing gum, patch etc, Wellbutrin and Chantix is discussed 3. Goal and date of compete cessation is discussed 4. Total time spent in smoking cessation is 15 min.  General Counseling: I have discussed the findings of the evaluation and examination with 12/28/2017.  I have also discussed any further diagnostic evaluation thatmay be needed or ordered today. Hang verbalizes understanding of the findings of todays visit. We also reviewed  her medications today and discussed drug interactions and side effects including but not limited excessive drowsiness and altered mental states. We also discussed that there is always a risk not just to her but also people around her. she has been encouraged to call the office with any questions or concerns that should arise related to todays visit.  No orders of the defined types were placed in this encounter.    Time spent: 25  I have personally obtained a history, examined the patient, evaluated laboratory and imaging results, formulated the assessment and plan and placed orders.    Yevonne Pax, MD [facility] Pulmonary and Critical Care Sleep medicine

## 2019-08-22 ENCOUNTER — Telehealth: Payer: Self-pay

## 2019-08-22 NOTE — Telephone Encounter (Signed)
Called lmom informing patient of appointment on 08/26/2019. klh °

## 2019-08-26 ENCOUNTER — Encounter: Payer: Self-pay | Admitting: Internal Medicine

## 2019-08-26 ENCOUNTER — Other Ambulatory Visit: Payer: Self-pay

## 2019-08-26 ENCOUNTER — Ambulatory Visit: Payer: BC Managed Care – PPO | Admitting: Internal Medicine

## 2019-08-26 VITALS — BP 106/34 | HR 66 | Temp 97.5°F | Resp 16 | Ht 61.0 in | Wt 103.2 lb

## 2019-08-26 DIAGNOSIS — F17219 Nicotine dependence, cigarettes, with unspecified nicotine-induced disorders: Secondary | ICD-10-CM | POA: Diagnosis not present

## 2019-08-26 DIAGNOSIS — J301 Allergic rhinitis due to pollen: Secondary | ICD-10-CM | POA: Diagnosis not present

## 2019-08-26 DIAGNOSIS — J4520 Mild intermittent asthma, uncomplicated: Secondary | ICD-10-CM | POA: Diagnosis not present

## 2019-08-26 DIAGNOSIS — J449 Chronic obstructive pulmonary disease, unspecified: Secondary | ICD-10-CM | POA: Diagnosis not present

## 2019-08-26 NOTE — Progress Notes (Signed)
[facility] [address] [place], [place] [address]  Pulmonary Sleep Medicine   Office Visit Note  Patient Name: Alice Sanders DOB: 1955/10/02 MRN [id number]  Date of Service: 08/26/2019  Complaints/HPI: Pt is here for pulmonary follow up.  She has a history of copd, asthma and allergies.  She reports her allergies have been bad this year, she has done well using her trelegy inhaler, and benzonatate as needed.  She also has a rescue inhaler that she uses periodically. She unfortunately continues to smoke.   Her copd is currently controlled with trelegy.   ROS  General: (-) fever, (-) chills, (-) night sweats, (-) weakness Skin: (-) rashes, (-) itching,. Eyes: (-) visual changes, (-) redness, (-) itching. Nose and Sinuses: (-) nasal stuffiness or itchiness, (-) postnasal drip, (-) nosebleeds, (-) sinus trouble. Mouth and Throat: (-) sore throat, (-) hoarseness. Neck: (-) swollen glands, (-) enlarged thyroid, (-) neck pain. Respiratory: - cough, (-) bloody sputum, - shortness of breath, - wheezing. Cardiovascular: - ankle swelling, (-) chest pain. Lymphatic: (-) lymph node enlargement. Neurologic: (-) numbness, (-) tingling. Psychiatric: (-) anxiety, (-) depression   Current Medication: Outpatient Encounter Medications as of 08/26/2019  Medication Sig  . aspirin EC 81 MG tablet Take 81 mg by mouth daily.  . benzonatate (TESSALON) 100 MG capsule Take 1-2 capsules (100-200 mg total) by mouth 2 (two) times daily as needed for cough.  . ergocalciferol (VITAMIN D2) 1.25 MG (50000 UT) capsule Take 50,000 Units by mouth once a week.  . estradiol-norethindrone (ACTIVELLA) 1-0.5 MG tablet   . Fluticasone-Umeclidin-Vilant (TRELEGY ELLIPTA) 100-62.5-25 MCG/INH AEPB Inhale into the lungs.  Marland Kitchen levothyroxine (SYNTHROID, LEVOTHROID) 25 MCG tablet Take 25 mcg by mouth daily before breakfast.  . metoprolol succinate (TOPROL-XL) 50 MG 24 hr tablet Take 50 mg by mouth daily. Take with  or immediately following a meal.  . simvastatin (ZOCOR) 20 MG tablet Take 20 mg by mouth daily.  Terald Sleeper HFA 108 (90 Base) MCG/ACT inhaler    No facility-administered encounter medications on file as of 08/26/2019.    Surgical History: Past Surgical History:  Procedure Laterality Date  . ABDOMINAL HYSTERECTOMY    . CARPAL TUNNEL RELEASE  2004  . TENNIS ELBOW RELEASE/NIRSCHEL PROCEDURE  2006    Medical History: Past Medical History:  Diagnosis Date  . Chronic bronchitis (HCC)   . Cough   . Smoker     Family History: Family History  Problem Relation Age of Onset  . Hypertension Mother   . Hyperlipidemia Mother   . Hypertension Father   . Hyperlipidemia Father   . Hypertension Brother   . Hyperlipidemia Brother     Social History: Social History   Socioeconomic History  . Marital status: Married    Spouse name: Not on file  . Number of children: Not on file  . Years of education: Not on file  . Highest education level: Not on file  Occupational History  . Not on file  Tobacco Use  . Smoking status: Current Some Day Smoker    Packs/day: 0.50    Types: Cigarettes  . Smokeless tobacco: Never Used  Vaping Use  . Vaping Use: Never used  Substance and Sexual Activity  . Alcohol use: Never  . Drug use: Never  . Sexual activity: Not on file  Other Topics Concern  . Not on file  Social History Narrative  . Not on file   Social Determinants of Health   Financial Resource Strain:   .  Difficulty of Paying Living Expenses:   Food Insecurity:   . Worried About Programme researcher, broadcasting/film/video in the Last Year:   . Barista in the Last Year:   Transportation Needs:   . Freight forwarder (Medical):   Marland Kitchen Lack of Transportation (Non-Medical):   Physical Activity:   . Days of Exercise per Week:   . Minutes of Exercise per Session:   Stress:   . Feeling of Stress :   Social Connections:   . Frequency of Communication with Friends and Family:   . Frequency of  Social Gatherings with Friends and Family:   . Attends Religious Services:   . Active Member of Clubs or Organizations:   . Attends Banker Meetings:   Marland Kitchen Marital Status:   Intimate Partner Violence:   . Fear of Current or Ex-Partner:   . Emotionally Abused:   Marland Kitchen Physically Abused:   . Sexually Abused:     Vital Signs: Blood pressure (!) 106/34, pulse 66, temperature (!) 97.5 F (36.4 C), resp. rate 16, height 5\' 1"  (1.549 m), weight 103 lb 3.2 oz (46.8 kg), SpO2 98 %.  Examination: General Appearance: The patient is well-developed, well-nourished, and in no distress. Skin: Gross inspection of skin unremarkable. Head: normocephalic, no gross deformities. Eyes: no gross deformities noted. ENT: ears appear grossly normal no exudates. Neck: Supple. No thyromegaly. No LAD. Respiratory: clear bilaterally. Cardiovascular: Normal S1 and S2 without murmur or rub. Extremities: No cyanosis. pulses are equal. Neurologic: Alert and oriented. No involuntary movements.  LABS: No results found for this or any previous visit (from the past 2160 hour(s)).  Radiology: DG Chest 2 View  Result Date: 12/26/2017 CLINICAL DATA:  Cough and shortness of Breath EXAM: CHEST - 2 VIEW COMPARISON:  None. FINDINGS: Cardiac shadows within normal limits. The lungs are hyperinflated consistent with COPD. Some minimal streaky density is noted in the left lower lobe. This could represent some early infiltrate and/or atelectasis. No bony abnormality is noted. IMPRESSION: COPD with mild streaky opacity in the left lower lobe posteriorly. This may represent some early infiltrate. Electronically Signed   By: 12/28/2017 M.D.   On: 12/26/2017 08:20    No results found.  No results found.    Assessment and Plan: Patient Active Problem List   Diagnosis Date Noted  . Cough   . Smoker    1. Chronic obstructive pulmonary disease, unspecified COPD type (HCC) Continue to use trelegy as prescribed.     - Pulmonary Function Test; Future  2. Mild intermittent asthma without complication Controlled, continue to use inhalers as discussed.   3. Seasonal allergic rhinitis due to pollen Continue with otc supplements, and using inhalers as needed.   4. Cigarette nicotine dependence with nicotine-induced disorder Smoking cessation counseling: 1. Pt acknowledges the risks of long term smoking, she will try to quite smoking. 2. Options for different medications including nicotine products, chewing gum, patch etc, Wellbutrin and Chantix is discussed 3. Goal and date of compete cessation is discussed 4. Total time spent in smoking cessation is 15 min.  - Pulmonary Function Test; Future  General Counseling: I have discussed the findings of the evaluation and examination with 12/28/2017.  I have also discussed any further diagnostic evaluation thatmay be needed or ordered today. Blanchie verbalizes understanding of the findings of todays visit. We also reviewed her medications today and discussed drug interactions and side effects including but not limited excessive drowsiness and altered mental states. We  also discussed that there is always a risk not just to her but also people around her. she has been encouraged to call the office with any questions or concerns that should arise related to todays visit.  No orders of the defined types were placed in this encounter.    Time spent: 30 This patient was seen by Blima Ledger AGNP-C in Collaboration with Dr. Freda Munro as a part of collaborative care agreement.   I have personally obtained a history, examined the patient, evaluated laboratory and imaging results, formulated the assessment and plan and placed orders.    Yevonne Pax, MD [facility] Pulmonary and Critical Care Sleep medicine

## 2019-09-01 ENCOUNTER — Telehealth: Payer: Self-pay

## 2019-09-01 NOTE — Telephone Encounter (Signed)
CONFIRMED PT APPT-OFFICE-AR 

## 2019-09-03 ENCOUNTER — Ambulatory Visit: Payer: BC Managed Care – PPO | Admitting: Internal Medicine

## 2019-09-17 ENCOUNTER — Ambulatory Visit (INDEPENDENT_AMBULATORY_CARE_PROVIDER_SITE_OTHER): Payer: BC Managed Care – PPO | Admitting: Internal Medicine

## 2019-09-17 ENCOUNTER — Other Ambulatory Visit: Payer: Self-pay

## 2019-09-17 DIAGNOSIS — R0602 Shortness of breath: Secondary | ICD-10-CM

## 2019-09-17 LAB — PULMONARY FUNCTION TEST

## 2019-09-23 ENCOUNTER — Other Ambulatory Visit: Payer: Self-pay

## 2019-09-23 ENCOUNTER — Ambulatory Visit: Payer: BC Managed Care – PPO | Admitting: Internal Medicine

## 2019-09-23 ENCOUNTER — Encounter: Payer: Self-pay | Admitting: Internal Medicine

## 2019-09-23 VITALS — BP 98/46 | HR 68 | Temp 97.3°F | Resp 16 | Ht 61.0 in | Wt 104.2 lb

## 2019-09-23 DIAGNOSIS — J449 Chronic obstructive pulmonary disease, unspecified: Secondary | ICD-10-CM

## 2019-09-23 DIAGNOSIS — J301 Allergic rhinitis due to pollen: Secondary | ICD-10-CM | POA: Diagnosis not present

## 2019-09-23 DIAGNOSIS — F17219 Nicotine dependence, cigarettes, with unspecified nicotine-induced disorders: Secondary | ICD-10-CM

## 2019-09-23 DIAGNOSIS — J4520 Mild intermittent asthma, uncomplicated: Secondary | ICD-10-CM

## 2019-09-23 NOTE — Progress Notes (Signed)
[facility] [address] [place], [place] [address]  Pulmonary Sleep Medicine   Office Visit Note  Patient Name: Alice Sanders DOB: 1955/11/06 MRN [id number]  Date of Service: 09/23/2019  Complaints/HPI: Pt is here for pulmonary follow up. She had a PFT.  Her PFT is a little worse.  Last year it was mild obstructive disease and now it shows moderate.  She continues to use Trelegy inhaler daily with good results.  She denies any new or worsening symptoms.   ROS  General: (-) fever, (-) chills, (-) night sweats, (-) weakness Skin: (-) rashes, (-) itching,. Eyes: (-) visual changes, (-) redness, (-) itching. Nose and Sinuses: (-) nasal stuffiness or itchiness, (-) postnasal drip, (-) nosebleeds, (-) sinus trouble. Mouth and Throat: (-) sore throat, (-) hoarseness. Neck: (-) swollen glands, (-) enlarged thyroid, (-) neck pain. Respiratory: - cough, (-) bloody sputum, - shortness of breath, - wheezing. Cardiovascular: - ankle swelling, (-) chest pain. Lymphatic: (-) lymph node enlargement. Neurologic: (-) numbness, (-) tingling. Psychiatric: (-) anxiety, (-) depression   Current Medication: Outpatient Encounter Medications as of 09/23/2019  Medication Sig   aspirin EC 81 MG tablet Take 81 mg by mouth daily.   benzonatate (TESSALON) 100 MG capsule Take 1-2 capsules (100-200 mg total) by mouth 2 (two) times daily as needed for cough.   ergocalciferol (VITAMIN D2) 1.25 MG (50000 UT) capsule Take 50,000 Units by mouth once a week.   estradiol-norethindrone (ACTIVELLA) 1-0.5 MG tablet    Fluticasone-Umeclidin-Vilant (TRELEGY ELLIPTA) 100-62.5-25 MCG/INH AEPB Inhale into the lungs.   levothyroxine (SYNTHROID, LEVOTHROID) 25 MCG tablet Take 25 mcg by mouth daily before breakfast.   metoprolol succinate (TOPROL-XL) 50 MG 24 hr tablet Take 50 mg by mouth daily. Take with or immediately following a meal.   simvastatin (ZOCOR) 20 MG tablet Take 20 mg by mouth daily.    VENTOLIN HFA 108 (90 Base) MCG/ACT inhaler    No facility-administered encounter medications on file as of 09/23/2019.    Surgical History: Past Surgical History:  Procedure Laterality Date   ABDOMINAL HYSTERECTOMY     CARPAL TUNNEL RELEASE  2004   TENNIS ELBOW RELEASE/NIRSCHEL PROCEDURE  2006    Medical History: Past Medical History:  Diagnosis Date   Chronic bronchitis (HCC)    Cough    Smoker     Family History: Family History  Problem Relation Age of Onset   Hypertension Mother    Hyperlipidemia Mother    Hypertension Father    Hyperlipidemia Father    Hypertension Brother    Hyperlipidemia Brother     Social History: Social History   Socioeconomic History   Marital status: Married    Spouse name: Not on file   Number of children: Not on file   Years of education: Not on file   Highest education level: Not on file  Occupational History   Not on file  Tobacco Use   Smoking status: Current Some Day Smoker    Packs/day: 0.50    Types: Cigarettes   Smokeless tobacco: Never Used  Vaping Use   Vaping Use: Never used  Substance and Sexual Activity   Alcohol use: Never   Drug use: Never   Sexual activity: Not on file  Other Topics Concern   Not on file  Social History Narrative   Not on file   Social Determinants of Health   Financial Resource Strain:    Difficulty of Paying Living Expenses:   Food Insecurity:  Worried About Programme researcher, broadcasting/film/video in the Last Year:    Barista in the Last Year:   Transportation Needs:    Freight forwarder (Medical):    Lack of Transportation (Non-Medical):   Physical Activity:    Days of Exercise per Week:    Minutes of Exercise per Session:   Stress:    Feeling of Stress :   Social Connections:    Frequency of Communication with Friends and Family:    Frequency of Social Gatherings with Friends and Family:    Attends Religious Services:    Active Member of Clubs  or Organizations:    Attends Engineer, structural:    Marital Status:   Intimate Partner Violence:    Fear of Current or Ex-Partner:    Emotionally Abused:    Physically Abused:    Sexually Abused:     Vital Signs: Blood pressure (!) 98/46, pulse 68, temperature (!) 97.3 F (36.3 C), resp. rate 16, height 5\' 1"  (1.549 m), weight 104 lb 3.2 oz (47.3 kg), SpO2 94 %.  Examination: General Appearance: The patient is well-developed, well-nourished, and in no distress. Skin: Gross inspection of skin unremarkable. Head: normocephalic, no gross deformities. Eyes: no gross deformities noted. ENT: ears appear grossly normal no exudates. Neck: Supple. No thyromegaly. No LAD. Respiratory: clear bilaterally. Cardiovascular: Normal S1 and S2 without murmur or rub. Extremities: No cyanosis. pulses are equal. Neurologic: Alert and oriented. No involuntary movements.  LABS: No results found for this or any previous visit (from the past 2160 hour(s)).  Radiology: DG Chest 2 View  Result Date: 12/26/2017 CLINICAL DATA:  Cough and shortness of Breath EXAM: CHEST - 2 VIEW COMPARISON:  None. FINDINGS: Cardiac shadows within normal limits. The lungs are hyperinflated consistent with COPD. Some minimal streaky density is noted in the left lower lobe. This could represent some early infiltrate and/or atelectasis. No bony abnormality is noted. IMPRESSION: COPD with mild streaky opacity in the left lower lobe posteriorly. This may represent some early infiltrate. Electronically Signed   By: 12/28/2017 M.D.   On: 12/26/2017 08:20    No results found.  No results found.    Assessment and Plan: Patient Active Problem List   Diagnosis Date Noted   Cough    Smoker     1. Chronic obstructive pulmonary disease, unspecified COPD type (HCC) Continue to use trelegy daily as directed.   2. Mild intermittent asthma without complication Continue current management.   3. Seasonal  allergic rhinitis due to pollen Stable,  Continue current management.   4. Cigarette nicotine dependence with nicotine-induced disorder Smoking cessation counseling: 1. Pt acknowledges the risks of long term smoking, she will try to quite smoking. 2. Options for different medications including nicotine products, chewing gum, patch etc, Wellbutrin and Chantix is discussed 3. Goal and date of compete cessation is discussed 4. Total time spent in smoking cessation is 15 min.   General Counseling: I have discussed the findings of the evaluation and examination with 12/28/2017.  I have also discussed any further diagnostic evaluation thatmay be needed or ordered today. Philis verbalizes understanding of the findings of todays visit. We also reviewed her medications today and discussed drug interactions and side effects including but not limited excessive drowsiness and altered mental states. We also discussed that there is always a risk not just to her but also people around her. she has been encouraged to call the office with any questions or concerns  that should arise related to todays visit.  No orders of the defined types were placed in this encounter.    Time spent: 25 This patient was seen by Orson Gear AGNP-C in Collaboration with Dr. Devona Konig as a part of collaborative care agreement.   I have personally obtained a history, examined the patient, evaluated laboratory and imaging results, formulated the assessment and plan and placed orders.    Allyne Gee, MD [facility] Pulmonary and Critical Care Sleep medicine

## 2019-09-23 NOTE — Procedures (Signed)
[facility] [address] [place], [address]  DATE OF SERVICE: September 17, 2019  Complete Pulmonary Function Testing Interpretation:  FINDINGS:  The forced vital capacity is mildly decreased.  FEV1 is 1.13 L which is 52% of predicted and is moderately decreased.  FEV1 FVC ratio is moderately decreased.  Postbronchodilator there is no significant improvement in FEV1 however clinical improvement may still occur in the absence of spirometric improvement.  Total lung capacity is increased residual volume is increased residual volume total lung capacity ratio is increased.  FRC is increased.  DLCO is severely decreased.  IMPRESSION:  This pulmonary function study is consistent with moderate obstructive lung disease.  There is significant reduction in the DLCO and there is no significant improvement postbronchodilator.  Yevonne Pax, MD [facility] Pulmonary Critical Care Medicine Sleep Medicine

## 2020-03-29 ENCOUNTER — Ambulatory Visit: Payer: BC Managed Care – PPO | Admitting: Hospice and Palliative Medicine

## 2020-03-29 ENCOUNTER — Encounter: Payer: Self-pay | Admitting: Hospice and Palliative Medicine

## 2020-03-29 VITALS — BP 114/61 | HR 71 | Temp 97.5°F | Resp 16 | Ht 61.0 in | Wt 103.4 lb

## 2020-03-29 DIAGNOSIS — Z122 Encounter for screening for malignant neoplasm of respiratory organs: Secondary | ICD-10-CM

## 2020-03-29 DIAGNOSIS — J301 Allergic rhinitis due to pollen: Secondary | ICD-10-CM | POA: Diagnosis not present

## 2020-03-29 DIAGNOSIS — J449 Chronic obstructive pulmonary disease, unspecified: Secondary | ICD-10-CM

## 2020-03-29 DIAGNOSIS — F17219 Nicotine dependence, cigarettes, with unspecified nicotine-induced disorders: Secondary | ICD-10-CM

## 2020-03-29 DIAGNOSIS — R0602 Shortness of breath: Secondary | ICD-10-CM

## 2020-03-29 DIAGNOSIS — R059 Cough, unspecified: Secondary | ICD-10-CM

## 2020-03-29 MED ORDER — BENZONATATE 100 MG PO CAPS: 100.0000 mg | ORAL_CAPSULE | Freq: Two times a day (BID) | ORAL | 0 refills | Status: DC | PRN

## 2020-03-29 NOTE — Progress Notes (Signed)
[facility] [address] [place], [place] [address]  Pulmonary Sleep Medicine   Office Visit Note  Patient Name: Alice Sanders DOB: August 09, 1955 MRN [id number]  Date of Service: 03/31/2020  Complaints/HPI: Patient is here for routine pulmonary follow-up Followed for COPD--using Trelegy daily, breathing remains well controlled, no recent exacerbations or hospitalizations  Last PFT 09/2019 moderate obstructive lung disease Unfortunately she does continue to smoke about a half pack per day No previous lung cancer screenings on file History of seasonal allergies--with recent warming weather she has noticed a flare of her allergies, rhinorrhea, coughing and sneezing--currently treating hr symptoms with Zyrtec and Flonase   ROS  General: (-) fever, (-) chills, (-) night sweats, (-) weakness Skin: (-) rashes, (-) itching,. Eyes: (-) visual changes, (-) redness, (-) itching. Nose and Sinuses: (-) nasal stuffiness or itchiness, (-) postnasal drip, (-) nosebleeds, (-) sinus trouble. Mouth and Throat: (-) sore throat, (-) hoarseness. Neck: (-) swollen glands, (-) enlarged thyroid, (-) neck pain. Respiratory: + cough, (-) bloody sputum, - shortness of breath, - wheezing. Cardiovascular: - ankle swelling, (-) chest pain. Lymphatic: (-) lymph node enlargement. Neurologic: (-) numbness, (-) tingling. Psychiatric: (-) anxiety, (-) depression   Current Medication: Outpatient Encounter Medications as of 03/29/2020  Medication Sig  . aspirin EC 81 MG tablet Take 81 mg by mouth daily.  . benzonatate (TESSALON) 100 MG capsule Take 1-2 capsules (100-200 mg total) by mouth 2 (two) times daily as needed for cough.  . ergocalciferol (VITAMIN D2) 1.25 MG (50000 UT) capsule Take 50,000 Units by mouth once a week.  . estradiol-norethindrone (ACTIVELLA) 1-0.5 MG tablet   . Fluticasone-Umeclidin-Vilant (TRELEGY ELLIPTA) 100-62.5-25 MCG/INH AEPB Inhale into the lungs.  Marland Kitchen levothyroxine  (SYNTHROID, LEVOTHROID) 25 MCG tablet Take 25 mcg by mouth daily before breakfast.  . metoprolol succinate (TOPROL-XL) 50 MG 24 hr tablet Take 50 mg by mouth daily. Take with or immediately following a meal.  . simvastatin (ZOCOR) 20 MG tablet Take 20 mg by mouth daily.  . VENTOLIN HFA 108 (90 Base) MCG/ACT inhaler   . [DISCONTINUED] benzonatate (TESSALON) 100 MG capsule Take 1-2 capsules (100-200 mg total) by mouth 2 (two) times daily as needed for cough.   No facility-administered encounter medications on file as of 03/29/2020.    Surgical History: Past Surgical History:  Procedure Laterality Date  . ABDOMINAL HYSTERECTOMY    . CARPAL TUNNEL RELEASE  2004  . TENNIS ELBOW RELEASE/NIRSCHEL PROCEDURE  2006    Medical History: Past Medical History:  Diagnosis Date  . Chronic bronchitis (HCC)   . Cough   . Smoker     Family History: Family History  Problem Relation Age of Onset  . Hypertension Mother   . Hyperlipidemia Mother   . Hypertension Father   . Hyperlipidemia Father   . Hypertension Brother   . Hyperlipidemia Brother     Social History: Social History   Socioeconomic History  . Marital status: Married    Spouse name: Not on file  . Number of children: Not on file  . Years of education: Not on file  . Highest education level: Not on file  Occupational History  . Not on file  Tobacco Use  . Smoking status: Current Some Day Smoker    Packs/day: 0.50    Types: Cigarettes  . Smokeless tobacco: Never Used  Vaping Use  . Vaping Use: Never used  Substance and Sexual Activity  . Alcohol use: Never  . Drug use: Never  .  Sexual activity: Not on file  Other Topics Concern  . Not on file  Social History Narrative  . Not on file   Social Determinants of Health   Financial Resource Strain: Not on file  Food Insecurity: Not on file  Transportation Needs: Not on file  Physical Activity: Not on file  Stress: Not on file  Social Connections: Not on file   Intimate Partner Violence: Not on file    Vital Signs: Blood pressure 114/61, pulse 71, temperature (!) 97.5 F (36.4 C), resp. rate 16, height 5\' 1"  (1.549 m), weight 103 lb 6.4 oz (46.9 kg), SpO2 98 %.  Examination: General Appearance: The patient is well-developed, well-nourished, and in no distress. Skin: Gross inspection of skin unremarkable. Head: normocephalic, no gross deformities. Eyes: no gross deformities noted. ENT: ears appear grossly normal no exudates. Neck: Supple. No thyromegaly. No LAD. Respiratory: Clear throughout, no rhonchi, rales or wheezing noted. Cardiovascular: Normal S1 and S2 without murmur or rub. Extremities: No cyanosis. pulses are equal. Neurologic: Alert and oriented. No involuntary movements.  LABS: No results found for this or any previous visit (from the past 2160 hour(s)).  Radiology: DG Chest 2 View  Result Date: 12/26/2017 CLINICAL DATA:  Cough and shortness of Breath EXAM: CHEST - 2 VIEW COMPARISON:  None. FINDINGS: Cardiac shadows within normal limits. The lungs are hyperinflated consistent with COPD. Some minimal streaky density is noted in the left lower lobe. This could represent some early infiltrate and/or atelectasis. No bony abnormality is noted. IMPRESSION: COPD with mild streaky opacity in the left lower lobe posteriorly. This may represent some early infiltrate. Electronically Signed   By: 12/28/2017 M.D.   On: 12/26/2017 08:20    No results found.  No results found.    Assessment and Plan: Patient Active Problem List   Diagnosis Date Noted  . Cough   . Smoker     1. Chronic obstructive pulmonary disease, unspecified COPD type (HCC) Stable at this time, continue with Trelegy  2. Seasonal allergic rhinitis due to pollen Continue with current regimen Tessalon to help relieve coughing sympotms - benzonatate (TESSALON) 100 MG capsule; Take 1-2 capsules (100-200 mg total) by mouth 2 (two) times daily as needed for  cough.  Dispense: 120 capsule; Refill: 0  3. Encounter for screening for lung cancer - Ambulatory Referral for Lung Cancer Scre  4. Cigarette nicotine dependence with nicotine-induced disorder Smoking cessation counseling: 1. Pt acknowledges the risks of long term smoking, she will try to quite smoking. 2. Options for different medications including nicotine products, chewing gum, patch etc, Wellbutrin and Chantix is discussed 3. Goal and date of compete cessation is discussed 4. Total time spent in smoking cessation is 15 min.   5. SOB (shortness of breath) Spirometry stable todayl, FEV1 1.1L, 49% predicted value - Spirometry with Graph  General Counseling: I have discussed the findings of the evaluation and examination with 12/28/2017.  I have also discussed any further diagnostic evaluation thatmay be needed or ordered today. Lyndi verbalizes understanding of the findings of todays visit. We also reviewed her medications today and discussed drug interactions and side effects including but not limited excessive drowsiness and altered mental states. We also discussed that there is always a risk not just to her but also people around her. she has been encouraged to call the office with any questions or concerns that should arise related to todays visit.  Orders Placed This Encounter  Procedures  . Ambulatory Referral for Lung  Cancer Scre    Referral Priority:   Routine    Referral Type:   Consultation    Referral Reason:   Specialty Services Required    Number of Visits Requested:   1  . Spirometry with Graph    Order Specific Question:   Where should this test be performed?    Answer:   [facility]     Time spent: 72  I have personally obtained a history, examined the patient, evaluated laboratory and imaging results, formulated the assessment and plan and placed orders. This patient was seen by Brent General AGNP-C in Collaboration with Dr. Freda Munro as a part of  collaborative care agreement.    Yevonne Pax, MD [facility] Pulmonary and Critical Care Sleep medicine

## 2020-03-31 ENCOUNTER — Encounter: Payer: Self-pay | Admitting: Hospice and Palliative Medicine

## 2020-03-31 NOTE — Patient Instructions (Signed)
Managing the Challenge of Quitting Smoking Quitting smoking is a physical and mental challenge. You will face cravings, withdrawal symptoms, and temptation. Before quitting, work with your health care provider to make a plan that can help you manage quitting. Preparation can help you quit and keep you from giving in. How to manage lifestyle changes Managing stress Stress can make you want to smoke, and wanting to smoke may cause stress. It is important to find ways to manage your stress. You might try some of the following:  Practice relaxation techniques. ? Breathe slowly and deeply, in through your nose and out through your mouth. ? Listen to music. ? Soak in a bath or take a shower. ? Imagine a peaceful place or vacation.  Get some support. ? Talk with family or friends about your stress. ? Join a support group. ? Talk with a counselor or therapist.  Get some physical activity. ? Go for a walk, run, or bike ride. ? Play a favorite sport. ? Practice yoga.   Medicines Talk with your health care provider about medicines that might help you deal with cravings and make quitting easier for you. Relationships Social situations can be difficult when you are quitting smoking. To manage this, you can:  Avoid parties and other social situations where people might be smoking.  Avoid alcohol.  Leave right away if you have the urge to smoke.  Explain to your family and friends that you are quitting smoking. Ask for support and let them know you might be a bit grumpy.  Plan activities where smoking is not an option. General instructions Be aware that many people gain weight after they quit smoking. However, not everyone does. To keep from gaining weight, have a plan in place before you quit and stick to the plan after you quit. Your plan should include:  Having healthy snacks. When you have a craving, it may help to: ? Eat popcorn, carrots, celery, or other cut vegetables. ? Chew  sugar-free gum.  Changing how you eat. ? Eat small portion sizes at meals. ? Eat 4-6 small meals throughout the day instead of 1-2 large meals a day. ? Be mindful when you eat. Do not watch television or do other things that might distract you as you eat.  Exercising regularly. ? Make time to exercise each day. If you do not have time for a long workout, do short bouts of exercise for 5-10 minutes several times a day. ? Do some form of strengthening exercise, such as weight lifting. ? Do some exercise that gets your heart beating and causes you to breathe deeply, such as walking fast, running, swimming, or biking. This is very important.  Drinking plenty of water or other low-calorie or no-calorie drinks. Drink 6-8 glasses of water daily.   How to recognize withdrawal symptoms Your body and mind may experience discomfort as you try to get used to not having nicotine in your system. These effects are called withdrawal symptoms. They may include:  Feeling hungrier than normal.  Having trouble concentrating.  Feeling irritable or restless.  Having trouble sleeping.  Feeling depressed.  Craving a cigarette. To manage withdrawal symptoms:  Avoid places, people, and activities that trigger your cravings.  Remember why you want to quit.  Get plenty of sleep.  Avoid coffee and other caffeinated drinks. These may worsen some of your symptoms. These symptoms may surprise you. But be assured that they are normal to have when quitting smoking. How to manage cravings   Come up with a plan for how to deal with your cravings. The plan should include the following:  A definition of the specific situation you want to deal with.  An alternative action you will take.  A clear idea for how this action will help.  The name of someone who might help you with this. Cravings usually last for 5-10 minutes. Consider taking the following actions to help you with your plan to deal with  cravings:  Keep your mouth busy. ? Chew sugar-free gum. ? Suck on hard candies or a straw. ? Brush your teeth.  Keep your hands and body busy. ? Change to a different activity right away. ? Squeeze or play with a ball. ? Do an activity or a hobby, such as making bead jewelry, practicing needlepoint, or working with wood. ? Mix up your normal routine. ? Take a short exercise break. Go for a quick walk or run up and down stairs.  Focus on doing something kind or helpful for someone else.  Call a friend or family member to talk during a craving.  Join a support group.  Contact a quitline. Where to find support To get help or find a support group:  Call the National Cancer Institute's Smoking Quitline: 1-800-QUIT NOW (784-8669)  Visit the website of the Substance Abuse and Mental Health Services Administration: www.samhsa.gov  Text QUIT to SmokefreeTXT: 478848 Where to find more information Visit these websites to find more information on quitting smoking:  National Cancer Institute: www.smokefree.gov  American Lung Association: www.lung.org  American Cancer Society: www.cancer.org  Centers for Disease Control and Prevention: www.cdc.gov  American Heart Association: www.heart.org Contact a health care provider if:  You want to change your plan for quitting.  The medicines you are taking are not helping.  Your eating feels out of control or you cannot sleep. Get help right away if:  You feel depressed or become very anxious. Summary  Quitting smoking is a physical and mental challenge. You will face cravings, withdrawal symptoms, and temptation to smoke again. Preparation can help you as you go through these challenges.  Try different techniques to manage stress, handle social situations, and prevent weight gain.  You can deal with cravings by keeping your mouth busy (such as by chewing gum), keeping your hands and body busy, calling family or friends, or  contacting a quitline for people who want to quit smoking.  You can deal with withdrawal symptoms by avoiding places where people smoke, getting plenty of rest, and avoiding drinks with caffeine. This information is not intended to replace advice given to you by your health care provider. Make sure you discuss any questions you have with your health care provider. Document Revised: 11/19/2018 Document Reviewed: 11/19/2018 Elsevier Patient Education  2021 Elsevier Inc.  

## 2020-04-01 ENCOUNTER — Encounter: Payer: Self-pay | Admitting: *Deleted

## 2020-04-01 ENCOUNTER — Telehealth: Payer: Self-pay | Admitting: *Deleted

## 2020-04-01 DIAGNOSIS — Z122 Encounter for screening for malignant neoplasm of respiratory organs: Secondary | ICD-10-CM

## 2020-04-01 DIAGNOSIS — Z87891 Personal history of nicotine dependence: Secondary | ICD-10-CM

## 2020-04-01 DIAGNOSIS — F17200 Nicotine dependence, unspecified, uncomplicated: Secondary | ICD-10-CM

## 2020-04-01 NOTE — Telephone Encounter (Signed)
Received referral for initial lung cancer screening scan. Contacted patient and obtained smoking history,(current, 35.25 pack year) as well as answering questions related to screening process. Patient denies signs of lung cancer such as weight loss or hemoptysis. Patient denies comorbidity that would prevent curative treatment if lung cancer were found. Patient is scheduled for shared decision making visit at 1030am and CT scan on 04/09/20 at 430pm.

## 2020-04-09 ENCOUNTER — Inpatient Hospital Stay: Payer: BC Managed Care – PPO | Attending: Oncology | Admitting: Oncology

## 2020-04-09 ENCOUNTER — Other Ambulatory Visit: Payer: Self-pay

## 2020-04-09 ENCOUNTER — Ambulatory Visit
Admission: RE | Admit: 2020-04-09 | Discharge: 2020-04-09 | Disposition: A | Discharge status: Home / Self Care | Payer: BC Managed Care – PPO | Source: Ambulatory Visit | Attending: Oncology | Admitting: Oncology

## 2020-04-09 DIAGNOSIS — F17210 Nicotine dependence, cigarettes, uncomplicated: Secondary | ICD-10-CM

## 2020-04-09 DIAGNOSIS — Z87891 Personal history of nicotine dependence: Secondary | ICD-10-CM | POA: Diagnosis not present

## 2020-04-09 DIAGNOSIS — Z122 Encounter for screening for malignant neoplasm of respiratory organs: Secondary | ICD-10-CM | POA: Diagnosis present

## 2020-04-09 DIAGNOSIS — F17200 Nicotine dependence, unspecified, uncomplicated: Secondary | ICD-10-CM | POA: Diagnosis present

## 2020-04-09 NOTE — Progress Notes (Signed)
Virtual Visit via Video Note  I connected with Alice Sanders on 04/09/20 at 10:30 AM EST by a video enabled telemedicine application and verified that I am speaking with the correct person using two identifiers.  Location: Patient: Home Provider: Clinic   I discussed the limitations of evaluation and management by telemedicine and the availability of in person appointments. The patient expressed understanding and agreed to proceed.  I discussed the assessment and treatment plan with the patient. The patient was provided an opportunity to ask questions and all were answered. The patient agreed with the plan and demonstrated an understanding of the instructions.   The patient was advised to call back or seek an in-person evaluation if the symptoms worsen or if the condition fails to improve as anticipated.   In accordance with CMS guidelines, patient has met eligibility criteria including age, absence of signs or symptoms of lung cancer.  Social History   Tobacco Use  . Smoking status: Current Every Day Smoker    Packs/day: 0.75    Years: 47.00    Pack years: 35.25    Types: Cigarettes  . Smokeless tobacco: Never Used  Vaping Use  . Vaping Use: Never used  Substance Use Topics  . Alcohol use: Never  . Drug use: Never      A shared decision-making session was conducted prior to the performance of CT scan. This includes one or more decision aids, includes benefits and harms of screening, follow-up diagnostic testing, over-diagnosis, false positive rate, and total radiation exposure.   Counseling on the importance of adherence to annual lung cancer LDCT screening, impact of co-morbidities, and ability or willingness to undergo diagnosis and treatment is imperative for compliance of the program.   Counseling on the importance of continued smoking cessation for former smokers; the importance of smoking cessation for current smokers, and information about tobacco cessation interventions  have been given to patient including [place] and [facility] [place] programs.   Written order for lung cancer screening with LDCT has been given to the patient and any and all questions have been answered to the best of my abilities.    Yearly follow up will be coordinated by Burgess Estelle, Thoracic Navigator.  I provided 15 minutes of face-to-face video visit time during this encounter, and > 50% was spent counseling as documented under my assessment & plan.   Jacquelin Hawking, NP

## 2020-04-12 ENCOUNTER — Telehealth: Payer: Self-pay | Admitting: *Deleted

## 2020-04-12 NOTE — Telephone Encounter (Addendum)
Notified patient of LDCT lung cancer screening program results with recommendation for 6 month follow up imaging. Also notified of incidental findings noted below and is encouraged to discuss further with PCP who will receive a copy of this note and/or the CT report. Patient verbalizes understanding. Specifically patient is aware of need for further evaluation of hepatic finding. Will notify referring provider and assist with evaluation as needed. Verbal message given to staff of patient's PCP, Dr. Patrecia Pace including recommendation for hepatic follow up.   IMPRESSION: 1. Lung-RADS 3, probably benign findings. Short-term follow-up in 6 months is recommended with repeat low-dose chest CT without contrast (please use the following order, "CT CHEST LCS NODULE FOLLOW-UP W/O CM"). 2. Aortic atherosclerosis. 3. Mild diffuse bronchial wall thickening with mild centrilobular and paraseptal emphysema; imaging findings suggestive of underlying COPD. 4. Intermediate attenuation lesion measuring 1.9 x 1.3 cm in segment 2 of the liver, incompletely characterized on today's noncontrast CT examination. Further evaluation with nonemergent abdominal MRI with and without IV gadolinium is recommended in the near future to provide definitive characterization.  These results will be called to the ordering clinician or representative by the Radiologist Assistant, and communication documented in the PACS or [employer].  Aortic Atherosclerosis (ICD10-I70.0) and Emphysema (ICD10-J43.9).

## 2020-06-19 IMAGING — CR DG CHEST 2V
1 series · 2 of 2 positions shown · non-contrast
Comparison: None.

CLINICAL DATA: Cough and shortness of Breath

EXAM:
CHEST - 2 VIEW

[Series 1: dg chest 2 view · 0.14mm/px · 2 of 2 slices shown]
[im 1/2]
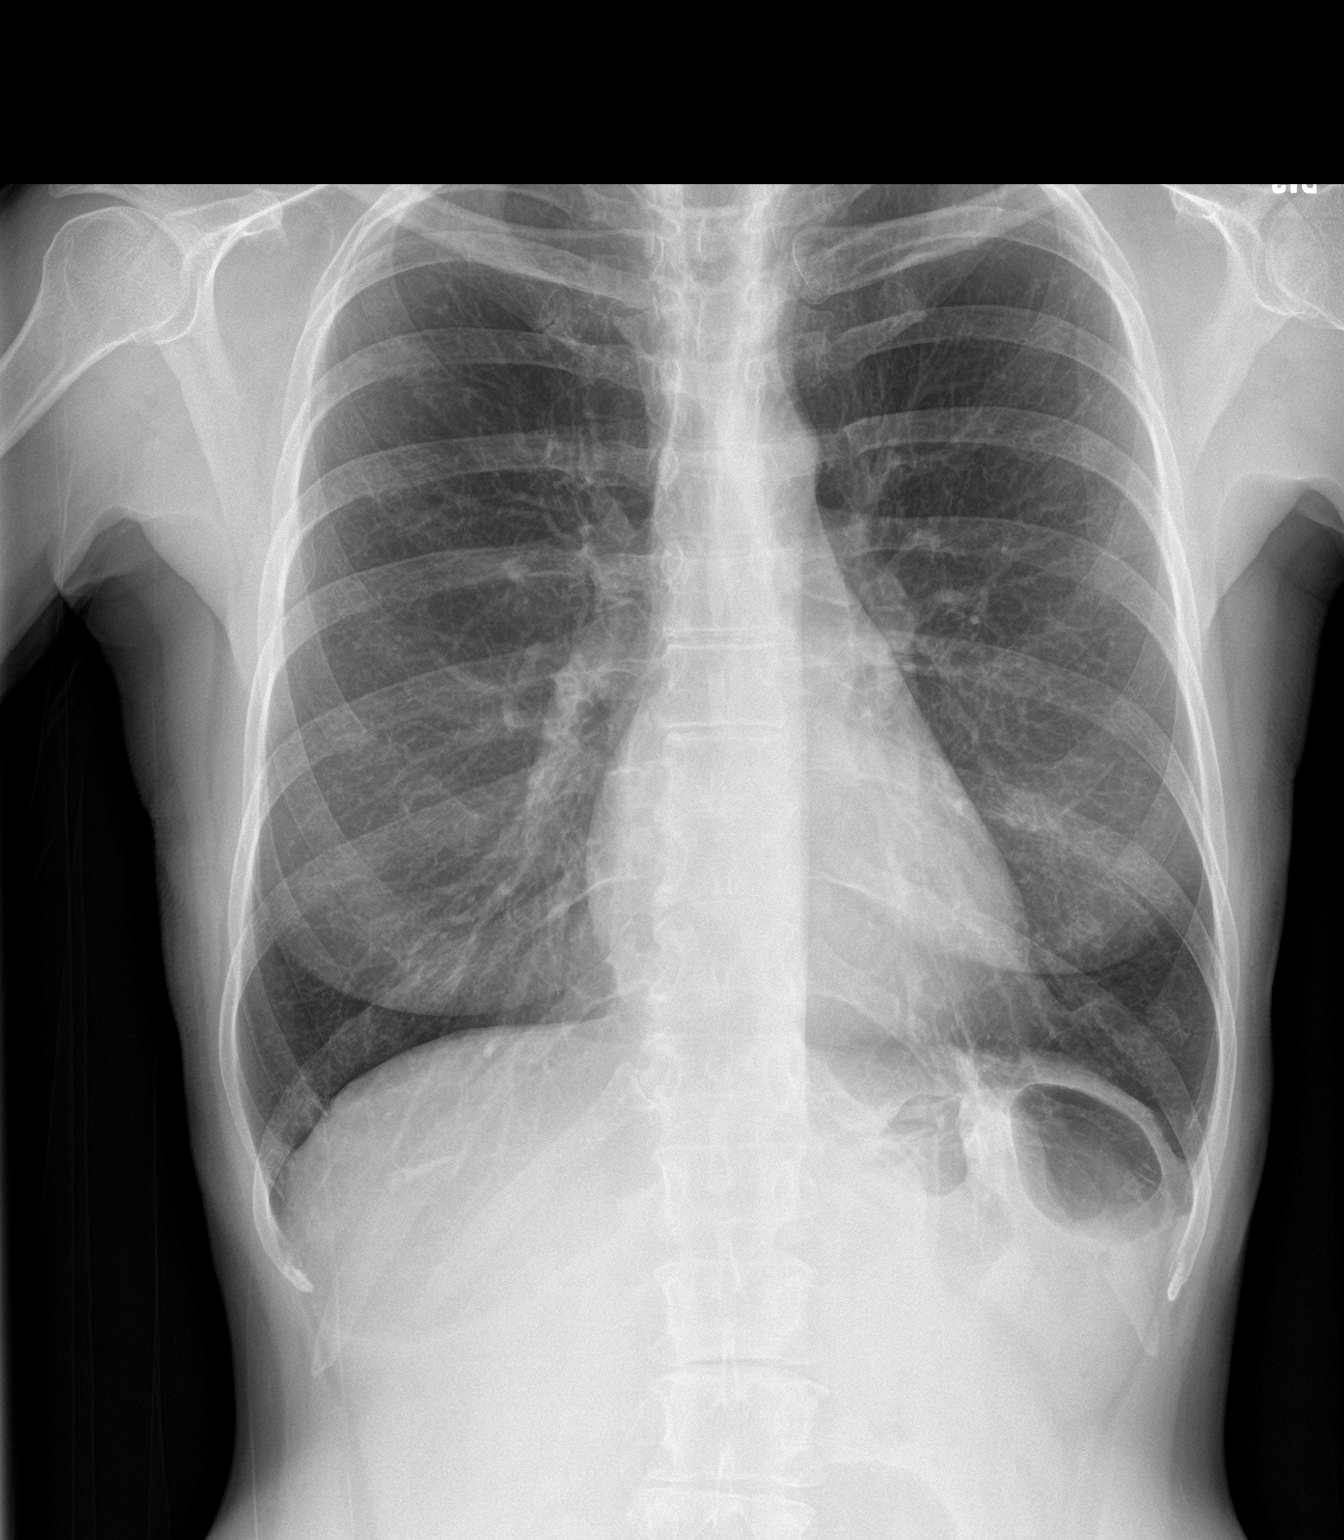
[im 2/2]
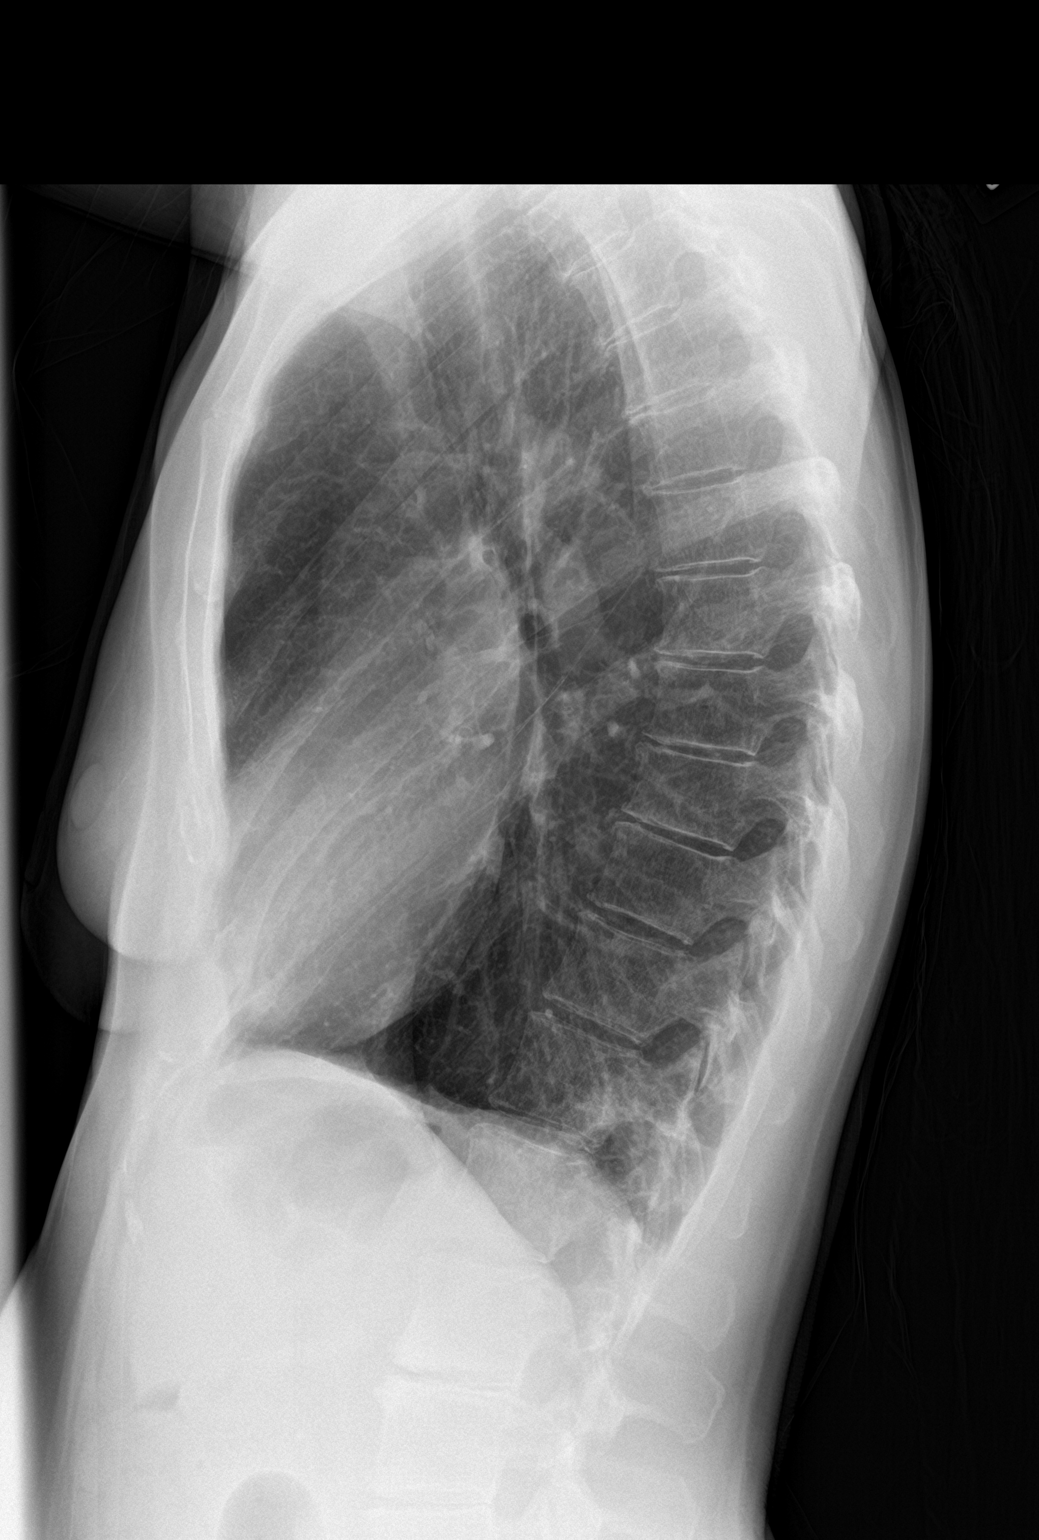

[2 of 2 positions shown; findings below may reference images not displayed]

FINDINGS: Cardiac shadows within normal limits. The lungs are hyperinflated
consistent with COPD. Some minimal streaky density is noted in the
left lower lobe. This could represent some early infiltrate and/or
atelectasis. No bony abnormality is noted.
IMPRESSION: COPD with mild streaky opacity in the left lower lobe posteriorly.
This may represent some early infiltrate.

## 2020-09-22 ENCOUNTER — Ambulatory Visit (INDEPENDENT_AMBULATORY_CARE_PROVIDER_SITE_OTHER): Payer: BC Managed Care – PPO | Admitting: Internal Medicine

## 2020-09-22 ENCOUNTER — Other Ambulatory Visit: Payer: Self-pay

## 2020-09-22 DIAGNOSIS — R0602 Shortness of breath: Secondary | ICD-10-CM

## 2020-10-05 ENCOUNTER — Encounter: Payer: Self-pay | Admitting: Internal Medicine

## 2020-10-05 ENCOUNTER — Ambulatory Visit: Payer: BC Managed Care – PPO | Admitting: Internal Medicine

## 2020-10-05 ENCOUNTER — Other Ambulatory Visit: Payer: Self-pay

## 2020-10-05 VITALS — BP 120/70 | HR 76 | Temp 98.5°F | Resp 16 | Ht 61.0 in | Wt 103.0 lb

## 2020-10-05 DIAGNOSIS — R911 Solitary pulmonary nodule: Secondary | ICD-10-CM

## 2020-10-05 DIAGNOSIS — J301 Allergic rhinitis due to pollen: Secondary | ICD-10-CM

## 2020-10-05 DIAGNOSIS — F17219 Nicotine dependence, cigarettes, with unspecified nicotine-induced disorders: Secondary | ICD-10-CM

## 2020-10-05 DIAGNOSIS — J449 Chronic obstructive pulmonary disease, unspecified: Secondary | ICD-10-CM

## 2020-10-05 MED ORDER — BENZONATATE 100 MG PO CAPS: 100.0000 mg | ORAL_CAPSULE | Freq: Two times a day (BID) | ORAL | 1 refills | Status: DC | PRN

## 2020-10-05 NOTE — Progress Notes (Signed)
[facility] [address] [place], [place] [address]  Pulmonary Sleep Medicine   Office Visit Note  Patient Name: Alice Sanders DOB: [birth year] MRN [id number]  Date of Service: 10/05/2020  Complaints/HPI: COPD. Unfortunately she is still smoking about a half pack. States that she has some SOB with exertion. She does not exwert herself over too much. Cough is infrequent. She states that she early morning cough noted. She has no admissions recently. Her last measured FEV1 was 41% and has severe COPD. Again discussed smoking cessation  ROS  General: (-) fever, (-) chills, (-) night sweats, (-) weakness Skin: (-) rashes, (-) itching,. Eyes: (-) visual changes, (-) redness, (-) itching. Nose and Sinuses: (-) nasal stuffiness or itchiness, (-) postnasal drip, (-) nosebleeds, (-) sinus trouble. Mouth and Throat: (-) sore throat, (-) hoarseness. Neck: (-) swollen glands, (-) enlarged thyroid, (-) neck pain. Respiratory: + cough, (-) bloody sputum, + shortness of breath, + wheezing. Cardiovascular: - ankle swelling, (-) chest pain. Lymphatic: (-) lymph node enlargement. Neurologic: (-) numbness, (-) tingling. Psychiatric: (-) anxiety, (-) depression   Current Medication: Outpatient Encounter Medications as of 10/05/2020  Medication Sig   aspirin EC 81 MG tablet Take 81 mg by mouth daily.   ergocalciferol (VITAMIN D2) 1.25 MG (50000 UT) capsule Take 50,000 Units by mouth once a week.   estradiol-norethindrone (ACTIVELLA) 1-0.5 MG tablet    Fluticasone-Umeclidin-Vilant 100-62.5-25 MCG/INH AEPB Inhale into the lungs.   levothyroxine (SYNTHROID, LEVOTHROID) 25 MCG tablet Take 25 mcg by mouth daily before breakfast.   metoprolol succinate (TOPROL-XL) 50 MG 24 hr tablet Take 50 mg by mouth daily. Take with or immediately following a meal.   simvastatin (ZOCOR) 20 MG tablet Take 20 mg by mouth daily.   VENTOLIN HFA 108 (90 Base) MCG/ACT inhaler    [DISCONTINUED] benzonatate  (TESSALON) 100 MG capsule Take 1-2 capsules (100-200 mg total) by mouth 2 (two) times daily as needed for cough.   benzonatate (TESSALON) 100 MG capsule Take 1-2 capsules (100-200 mg total) by mouth 2 (two) times daily as needed for cough.   No facility-administered encounter medications on file as of 10/05/2020.    Surgical History: Past Surgical History:  Procedure Laterality Date   ABDOMINAL HYSTERECTOMY     CARPAL TUNNEL RELEASE  2004   TENNIS ELBOW RELEASE/NIRSCHEL PROCEDURE  2006    Medical History: Past Medical History:  Diagnosis Date   Chronic bronchitis (HCC)    Cough    Smoker     Family History: Family History  Problem Relation Age of Onset   Hypertension Mother    Hyperlipidemia Mother    Hypertension Father    Hyperlipidemia Father    Hypertension Brother    Hyperlipidemia Brother     Social History: Social History   Socioeconomic History   Marital status: Married    Spouse name: Not on file   Number of children: Not on file   Years of education: Not on file   Highest education level: Not on file  Occupational History   Not on file  Tobacco Use   Smoking status: Every Day    Packs/day: 0.75    Years: 47.00    Pack years: 35.25    Types: Cigarettes   Smokeless tobacco: Never  Vaping Use   Vaping Use: Never used  Substance and Sexual Activity   Alcohol use: Never   Drug use: Never   Sexual activity: Not on file  Other Topics Concern   Not on  file  Social History Narrative   Not on file   Social Determinants of Health   Financial Resource Strain: Not on file  Food Insecurity: Not on file  Transportation Needs: Not on file  Physical Activity: Not on file  Stress: Not on file  Social Connections: Not on file  Intimate Partner Violence: Not on file    Vital Signs: Blood pressure 120/70, pulse 76, temperature 98.5 F (36.9 C), resp. rate 16, height 5\' 1"  (1.549 m), weight 103 lb (46.7 kg), SpO2 96 %.  Examination: General Appearance:  The patient is well-developed, well-nourished, and in no distress. Skin: Gross inspection of skin unremarkable. Head: normocephalic, no gross deformities. Eyes: no gross deformities noted. ENT: ears appear grossly normal no exudates. Neck: Supple. No thyromegaly. No LAD. Respiratory: no rhonchi noted distant. Cardiovascular: Normal S1 and S2 without murmur or rub. Extremities: No cyanosis. pulses are equal. Neurologic: Alert and oriented. No involuntary movements.  LABS: No results found for this or any previous visit (from the past 2160 hour(s)).  Radiology: CT CHEST LUNG CANCER SCREENING LOW DOSE WO CONTRAST  Result Date: 04/11/2020 CLINICAL DATA:  65 year old female current smoker with 35 pack-year history of smoking. Lung cancer screening examination. EXAM: CT CHEST WITHOUT CONTRAST LOW-DOSE FOR LUNG CANCER SCREENING TECHNIQUE: Multidetector CT imaging of the chest was performed following the standard protocol without IV contrast. COMPARISON:  No priors. FINDINGS: Cardiovascular: Heart size is normal. There is no significant pericardial fluid, thickening or pericardial calcification. Aortic atherosclerosis. No definite coronary artery calcifications. Mediastinum/Nodes: No pathologically enlarged mediastinal or hilar lymph nodes. Please note that accurate exclusion of hilar adenopathy is limited on noncontrast CT scans. Esophagus is unremarkable in appearance. No axillary lymphadenopathy. Lungs/Pleura: Multiple pulmonary nodules are noted throughout both lungs, largest of which is in the medial aspect of the right upper lobe (axial image 69 of series 3), with a volume derived mean diameter of 6.5 mm. No other larger more suspicious appearing pulmonary nodules or masses are noted. No acute consolidative airspace disease. No pleural effusions. Diffuse bronchial wall thickening with mild centrilobular and paraseptal emphysema. Upper Abdomen: 1.9 x 1.3 cm intermediate attenuation lesion in segment 2  of the liver (axial image 54 of series 2), incompletely characterized on today's noncontrast CT examination. Musculoskeletal: There are no aggressive appearing lytic or blastic lesions noted in the visualized portions of the skeleton. IMPRESSION: 1. Lung-RADS 3, probably benign findings. Short-term follow-up in 6 months is recommended with repeat low-dose chest CT without contrast (please use the following order, "CT CHEST LCS NODULE FOLLOW-UP W/O CM"). 2. Aortic atherosclerosis. 3. Mild diffuse bronchial wall thickening with mild centrilobular and paraseptal emphysema; imaging findings suggestive of underlying COPD. 4. Intermediate attenuation lesion measuring 1.9 x 1.3 cm in segment 2 of the liver, incompletely characterized on today's noncontrast CT examination. Further evaluation with nonemergent abdominal MRI with and without IV gadolinium is recommended in the near future to provide definitive characterization. These results will be called to the ordering clinician or representative by the Radiologist Assistant, and communication documented in the PACS or 64. Aortic Atherosclerosis (ICD10-I70.0) and Emphysema (ICD10-J43.9). Electronically Signed   By: [employer] M.D.   On: 04/11/2020 08:10    No results found.  No results found.    Assessment and Plan: Patient Active Problem List   Diagnosis Date Noted   Cough    Smoker     1. Seasonal allergic rhinitis due to pollen  - benzonatate (TESSALON) 100 MG capsule; Take 1-2  capsules (100-200 mg total) by mouth 2 (two) times daily as needed for cough.  Dispense: 120 capsule; Refill: 1  2. Chronic obstructive pulmonary disease, unspecified COPD type (HCC) Severe disease unfortunately contiues to smoke Smoking cessation discussed. She is on trelegy and ventolin  3. Cigarette nicotine dependence with nicotine-induced disorder STOP SMOKING as discussed different methods  4. Pulmonary nodule 1 cm or greater in diameter Last CT  done in Feb and revels likely benign findings with recs to do a repeat. She wants to defer for a year   General Counseling: I have discussed the findings of the evaluation and examination with Tzipora.  I have also discussed any further diagnostic evaluation thatmay be needed or ordered today. Branda verbalizes understanding of the findings of todays visit. We also reviewed her medications today and discussed drug interactions and side effects including but not limited excessive drowsiness and altered mental states. We also discussed that there is always a risk not just to her but also people around her. she has been encouraged to call the office with any questions or concerns that should arise related to todays visit.  No orders of the defined types were placed in this encounter.    Time spent: 76  I have personally obtained a history, examined the patient, evaluated laboratory and imaging results, formulated the assessment and plan and placed orders.    Yevonne Pax, MD [facility] Pulmonary and Critical Care Sleep medicine

## 2020-10-05 NOTE — Patient Instructions (Signed)
Steps to Quit Smoking Smoking tobacco is the leading cause of preventable death. It can affect almost every organ in the body. Smoking puts you and people around you at risk for many serious, long-lasting (chronic) diseases. Quitting smoking can be hard, but it is one of the best things that you can do for your health. It is never too late to quit. How do I get ready to quit? When you decide to quit smoking, make a plan to help you succeed. Before you quit: Pick a date to quit. Set a date within the next 2 weeks to give you time to prepare. Write down the reasons why you are quitting. Keep this list in places where you will see it often. Tell your family, friends, and co-workers that you are quitting. Their support is important. Talk with your doctor about the choices that may help you quit. Find out if your health insurance will pay for these treatments. Know the people, places, things, and activities that make you want to smoke (triggers). Avoid them. What first steps can I take to quit smoking? Throw away all cigarettes at home, at work, and in your car. Throw away the things that you use when you smoke, such as ashtrays and lighters. Clean your car. Make sure to empty the ashtray. Clean your home, including curtains and carpets. What can I do to help me quit smoking? Talk with your doctor about taking medicines and seeing a counselor at the same time. You are more likely to succeed when you do both. If you are pregnant or breastfeeding, talk with your doctor about counseling or other ways to quit smoking. Do not take medicine to help you quit smoking unless your doctor tells you to do so. To quit smoking: Quit right away Quit smoking totally, instead of slowly cutting back on how much you smoke over a period of time. Go to counseling. You are more likely to quit if you go to counseling sessions regularly. Take medicine You may take medicines to help you quit. Some medicines need a  prescription, and some you can buy over-the-counter. Some medicines may contain a drug called nicotine to replace the nicotine in cigarettes. Medicines may: Help you to stop having the desire to smoke (cravings). Help to stop the problems that come when you stop smoking (withdrawal symptoms). Your doctor may ask you to use: Nicotine patches, gum, or lozenges. Nicotine inhalers or sprays. Non-nicotine medicine that is taken by mouth. Find resources Find resources and other ways to help you quit smoking and remain smoke-free after you quit. These resources are most helpful when you use them often. They include: Online chats with a counselor. Phone quitlines. Printed self-help materials. Support groups or group counseling. Text messaging programs. Mobile phone apps. Use apps on your mobile phone or tablet that can help you stick to your quit plan. There are many free apps for mobile phones and tablets as well as websites. Examples include Quit Guide from the CDC and smokefree.gov  What things can I do to make it easier to quit?  Talk to your family and friends. Ask them to support and encourage you. Call a phone quitline (1-800-QUIT-NOW), reach out to support groups, or work with a counselor. Ask people who smoke to not smoke around you. Avoid places that make you want to smoke, such as: Bars. Parties. Smoke-break areas at work. Spend time with people who do not smoke. Lower the stress in your life. Stress can make you want to   smoke. Try these things to help your stress: Getting regular exercise. Doing deep-breathing exercises. Doing yoga. Meditating. Doing a body scan. To do this, close your eyes, focus on one area of your body at a time from head to toe. Notice which parts of your body are tense. Try to relax the muscles in those areas. How will I feel when I quit smoking? Day 1 to 3 weeks Within the first 24 hours, you may start to have some problems that come from quitting tobacco.  These problems are very bad 2-3 days after you quit, but they do not often last for more than 2-3 weeks. You may get these symptoms: Mood swings. Feeling restless, nervous, angry, or annoyed. Trouble concentrating. Dizziness. Strong desire for high-sugar foods and nicotine. Weight gain. Trouble pooping (constipation). Feeling like you may vomit (nausea). Coughing or a sore throat. Changes in how the medicines that you take for other issues work in your body. Depression. Trouble sleeping (insomnia). Week 3 and afterward After the first 2-3 weeks of quitting, you may start to notice more positive results, such as: Better sense of smell and taste. Less coughing and sore throat. Slower heart rate. Lower blood pressure. Clearer skin. Better breathing. Fewer sick days. Quitting smoking can be hard. Do not give up if you fail the first time. Some people need to try a few times before they succeed. Do your best to stick to your quit plan, and talk with your doctor if you have any questions or concerns. Summary Smoking tobacco is the leading cause of preventable death. Quitting smoking can be hard, but it is one of the best things that you can do for your health. When you decide to quit smoking, make a plan to help you succeed. Quit smoking right away, not slowly over a period of time. When you start quitting, seek help from your doctor, family, or friends. This information is not intended to replace advice given to you by your health care provider. Make sure you discuss any questions you have with your health care provider. Document Revised: 10/25/2018 Document Reviewed: 04/20/2018 Elsevier Patient Education  2022 [employer] Inc.  

## 2020-10-05 NOTE — Procedures (Signed)
[facility] [address] [place], [address]    Complete Pulmonary Function Testing Interpretation:  FINDINGS:  The forced vital capacity is mildly decreased FEV1 is 0.88 L which is 41% of predicted and is severely decreased.  FEV1 FVC ratio is severely decreased.  Total lung capacity is increased residual volume is increased residual volume total lung capacity ratio is increased.  FRC is increased.  DLCO was mildly decreased.  Postbronchodilator no significant change in FEV1 clinical improvement may occur in the absence of spirometric improvement does not preclude the use of bronchodilators  IMPRESSION:  This PFT is consistent with severe obstructive lung disease.  There did not appear to be significant response to bronchodilator  Yevonne Pax, MD [facility] Pulmonary Critical Care Medicine Sleep Medicine

## 2020-10-07 LAB — PULMONARY FUNCTION TEST

## 2021-03-07 ENCOUNTER — Telehealth: Payer: Self-pay | Admitting: Acute Care

## 2021-03-07 NOTE — Telephone Encounter (Signed)
Left detailed message for pt to call back to schedule f/u lung screening CT scan.  °

## 2021-04-05 ENCOUNTER — Encounter: Payer: Self-pay | Admitting: Internal Medicine

## 2021-04-05 ENCOUNTER — Other Ambulatory Visit: Payer: Self-pay

## 2021-04-05 ENCOUNTER — Ambulatory Visit: Payer: BC Managed Care – PPO | Admitting: Internal Medicine

## 2021-04-05 VITALS — BP 126/66 | HR 79 | Temp 98.4°F | Resp 16 | Ht 61.0 in | Wt 97.2 lb

## 2021-04-05 DIAGNOSIS — J449 Chronic obstructive pulmonary disease, unspecified: Secondary | ICD-10-CM | POA: Diagnosis not present

## 2021-04-05 DIAGNOSIS — J301 Allergic rhinitis due to pollen: Secondary | ICD-10-CM | POA: Diagnosis not present

## 2021-04-05 DIAGNOSIS — R0602 Shortness of breath: Secondary | ICD-10-CM | POA: Diagnosis not present

## 2021-04-05 DIAGNOSIS — F17219 Nicotine dependence, cigarettes, with unspecified nicotine-induced disorders: Secondary | ICD-10-CM | POA: Diagnosis not present

## 2021-04-05 DIAGNOSIS — J4489 Other specified chronic obstructive pulmonary disease: Secondary | ICD-10-CM

## 2021-04-05 MED ORDER — BENZONATATE 100 MG PO CAPS: 100.0000 mg | ORAL_CAPSULE | Freq: Two times a day (BID) | ORAL | 1 refills | Status: AC | PRN

## 2021-04-05 NOTE — Progress Notes (Signed)
[facility] [place], [place] [address]  Pulmonary Sleep Medicine   Office Visit Note  Patient Name: Alice Sanders DOB: [birth year] MRN [id number]  Date of Service: 04/05/2021  Complaints/HPI: COPD she is still smoking she states she has cu back the amount but still smokes. She has still being having some cough noted dry and sometimes with sputum early in the morning. Patient has no fevers or chills. She denies having chest pain noted. Notes little dyspnea with exertion  ROS  General: (-) fever, (-) chills, (-) night sweats, (-) weakness Skin: (-) rashes, (-) itching,. Eyes: (-) visual changes, (-) redness, (-) itching. Nose and Sinuses: (-) nasal stuffiness or itchiness, (-) postnasal drip, (-) nosebleeds, (-) sinus trouble. Mouth and Throat: (-) sore throat, (-) hoarseness. Neck: (-) swollen glands, (-) enlarged thyroid, (-) neck pain. Respiratory: + cough, (-) bloody sputum, - shortness of breath, - wheezing. Cardiovascular: - ankle swelling, (-) chest pain. Lymphatic: (-) lymph node enlargement. Neurologic: (-) numbness, (-) tingling. Psychiatric: (-) anxiety, (-) depression   Current Medication: Outpatient Encounter Medications as of 04/05/2021  Medication Sig   aspirin EC 81 MG tablet Take 81 mg by mouth daily.   benzonatate (TESSALON) 100 MG capsule Take 1-2 capsules (100-200 mg total) by mouth 2 (two) times daily as needed for cough.   ergocalciferol (VITAMIN D2) 1.25 MG (50000 UT) capsule Take 50,000 Units by mouth once a week.   estradiol-norethindrone (ACTIVELLA) 1-0.5 MG tablet    Fluticasone-Umeclidin-Vilant 100-62.5-25 MCG/INH AEPB Inhale into the lungs.   levothyroxine (SYNTHROID, LEVOTHROID) 25 MCG tablet Take 25 mcg by mouth daily before breakfast.   metoprolol succinate (TOPROL-XL) 50 MG 24 hr tablet Take 50 mg by mouth daily. Take with or immediately following a meal.   simvastatin (ZOCOR) 20 MG tablet Take 20 mg by mouth daily.    VENTOLIN HFA 108 (90 Base) MCG/ACT inhaler    No facility-administered encounter medications on file as of 04/05/2021.    Surgical History: Past Surgical History:  Procedure Laterality Date   ABDOMINAL HYSTERECTOMY     CARPAL TUNNEL RELEASE  2004   TENNIS ELBOW RELEASE/NIRSCHEL PROCEDURE  2006    Medical History: Past Medical History:  Diagnosis Date   Chronic bronchitis (HCC)    Cough    Smoker     Family History: Family History  Problem Relation Age of Onset   Hypertension Mother    Hyperlipidemia Mother    Hypertension Father    Hyperlipidemia Father    Hypertension Brother    Hyperlipidemia Brother     Social History: Social History   Socioeconomic History   Marital status: Married    Spouse name: Not on file   Number of children: Not on file   Years of education: Not on file   Highest education level: Not on file  Occupational History   Not on file  Tobacco Use   Smoking status: Every Day    Packs/day: 0.75    Years: 47.00    Pack years: 35.25    Types: Cigarettes   Smokeless tobacco: Never  Vaping Use   Vaping Use: Never used  Substance and Sexual Activity   Alcohol use: Never   Drug use: Never   Sexual activity: Not on file  Other Topics Concern   Not on file  Social History Narrative   Not on file   Social Determinants of Health   Financial Resource Strain: Not on file  Food Insecurity: Not on file  Transportation Needs: Not on file  Physical Activity: Not on file  Stress: Not on file  Social Connections: Not on file  Intimate Partner Violence: Not on file    Vital Signs: Blood pressure 126/66, pulse 79, temperature 98.4 F (36.9 C), resp. rate 16, height 5\' 1"  (1.549 m), weight 97 lb 3.2 oz (44.1 kg), SpO2 97 %.  Examination: General Appearance: The patient is well-developed, well-nourished, and in no distress. Skin: Gross inspection of skin unremarkable. Head: normocephalic, no gross deformities. Eyes: no gross deformities  noted. ENT: ears appear grossly normal no exudates. Neck: Supple. No thyromegaly. No LAD. Respiratory: no rhonchi noted. Cardiovascular: Normal S1 and S2 without murmur or rub. Extremities: No cyanosis. pulses are equal. Neurologic: Alert and oriented. No involuntary movements.  LABS: No results found for this or any previous visit (from the past 2160 hour(s)).  Radiology: CT CHEST LUNG CANCER SCREENING LOW DOSE WO CONTRAST  Result Date: 04/11/2020 CLINICAL DATA:  66 year old female current smoker with 35 pack-year history of smoking. Lung cancer screening examination. EXAM: CT CHEST WITHOUT CONTRAST LOW-DOSE FOR LUNG CANCER SCREENING TECHNIQUE: Multidetector CT imaging of the chest was performed following the standard protocol without IV contrast. COMPARISON:  No priors. FINDINGS: Cardiovascular: Heart size is normal. There is no significant pericardial fluid, thickening or pericardial calcification. Aortic atherosclerosis. No definite coronary artery calcifications. Mediastinum/Nodes: No pathologically enlarged mediastinal or hilar lymph nodes. Please note that accurate exclusion of hilar adenopathy is limited on noncontrast CT scans. Esophagus is unremarkable in appearance. No axillary lymphadenopathy. Lungs/Pleura: Multiple pulmonary nodules are noted throughout both lungs, largest of which is in the medial aspect of the right upper lobe (axial image 69 of series 3), with a volume derived mean diameter of 6.5 mm. No other larger more suspicious appearing pulmonary nodules or masses are noted. No acute consolidative airspace disease. No pleural effusions. Diffuse bronchial wall thickening with mild centrilobular and paraseptal emphysema. Upper Abdomen: 1.9 x 1.3 cm intermediate attenuation lesion in segment 2 of the liver (axial image 54 of series 2), incompletely characterized on today's noncontrast CT examination. Musculoskeletal: There are no aggressive appearing lytic or blastic lesions noted in  the visualized portions of the skeleton. IMPRESSION: 1. Lung-RADS 3, probably benign findings. Short-term follow-up in 6 months is recommended with repeat low-dose chest CT without contrast (please use the following order, "CT CHEST LCS NODULE FOLLOW-UP W/O CM"). 2. Aortic atherosclerosis. 3. Mild diffuse bronchial wall thickening with mild centrilobular and paraseptal emphysema; imaging findings suggestive of underlying COPD. 4. Intermediate attenuation lesion measuring 1.9 x 1.3 cm in segment 2 of the liver, incompletely characterized on today's noncontrast CT examination. Further evaluation with nonemergent abdominal MRI with and without IV gadolinium is recommended in the near future to provide definitive characterization. These results will be called to the ordering clinician or representative by the Radiologist Assistant, and communication documented in the PACS or [employer]. Aortic Atherosclerosis (ICD10-I70.0) and Emphysema (ICD10-J43.9). Electronically Signed   By: Vinnie Langton M.D.   On: 04/11/2020 08:10    No results found.  No results found.    Assessment and Plan: Patient Active Problem List   Diagnosis Date Noted   Cough    Smoker     1. SOB (shortness of breath) COPD as well as some smoking patient unfortunately is not ready for cigarette smoking to be stopped - Spirometry with Graph  2. Seasonal allergic rhinitis due to pollen Treat with antihistamines as needed - benzonatate (TESSALON) 100 MG capsule; Take  1-2 capsules (100-200 mg total) by mouth 2 (two) times daily as needed for cough.  Dispense: 120 capsule; Refill: 1  3. Obstructive chronic bronchitis without exacerbation ([place]) Multifactorial with ongoing tobacco use strongly recommended for her to stop smoking  4. Cigarette nicotine dependence with nicotine-induced disorder Unfortunately she states she has no plans immediately to stop smoking. She has been a longtime smoker Ready to quit: No Counseling  given: Yes     General Counseling: I have discussed the findings of the evaluation and examination with Alice Sanders.  I have also discussed any further diagnostic evaluation thatmay be needed or ordered today. Alice Sanders verbalizes understanding of the findings of todays visit. We also reviewed her medications today and discussed drug interactions and side effects including but not limited excessive drowsiness and altered mental states. We also discussed that there is always a risk not just to her but also people around her. she has been encouraged to call the office with any questions or concerns that should arise related to todays visit.  Orders Placed This Encounter  Procedures   Spirometry with Graph    Order Specific Question:   Where should this test be performed?    Answer:   [facility]    Order Specific Question:   Basic spirometry    Answer:   Yes    Order Specific Question:   Spirometry pre & post bronchodilator    Answer:   No     Time spent: 35  I have personally obtained a history, examined the patient, evaluated laboratory and imaging results, formulated the assessment and plan and placed orders.    Allyne Gee, MD [facility] Pulmonary and Critical Care Sleep medicine

## 2021-04-05 NOTE — Patient Instructions (Signed)
Steps to Quit Smoking Smoking tobacco is the leading cause of preventable death. It can affect almost every organ in the body. Smoking puts you and people around you at risk for many serious, long-lasting (chronic) diseases. Quitting smoking can be hard, but it is one of the best things that you can do for your health. It is never too late to quit. How do I get ready to quit? When you decide to quit smoking, make a plan to help you succeed. Before you quit: Pick a date to quit. Set a date within the next 2 weeks to give you time to prepare. Write down the reasons why you are quitting. Keep this list in places where you will see it often. Tell your family, friends, and co-workers that you are quitting. Their support is important. Talk with your doctor about the choices that may help you quit. Find out if your health insurance will pay for these treatments. Know the people, places, things, and activities that make you want to smoke (triggers). Avoid them. What first steps can I take to quit smoking? Throw away all cigarettes at home, at work, and in your car. Throw away the things that you use when you smoke, such as ashtrays and lighters. Clean your car. Make sure to empty the ashtray. Clean your home, including curtains and carpets. What can I do to help me quit smoking? Talk with your doctor about taking medicines and seeing a counselor at the same time. You are more likely to succeed when you do both. If you are pregnant or breastfeeding, talk with your doctor about counseling or other ways to quit smoking. Do not take medicine to help you quit smoking unless your doctor tells you to do so. To quit smoking: Quit right away Quit smoking totally, instead of slowly cutting back on how much you smoke over a period of time. Go to counseling. You are more likely to quit if you go to counseling sessions regularly. Take medicine You may take medicines to help you quit. Some medicines need a  prescription, and some you can buy over-the-counter. Some medicines may contain a drug called nicotine to replace the nicotine in cigarettes. Medicines may: Help you to stop having the desire to smoke (cravings). Help to stop the problems that come when you stop smoking (withdrawal symptoms). Your doctor may ask you to use: Nicotine patches, gum, or lozenges. Nicotine inhalers or sprays. Non-nicotine medicine that is taken by mouth. Find resources Find resources and other ways to help you quit smoking and remain smoke-free after you quit. These resources are most helpful when you use them often. They include: Online chats with a Social worker. Phone quitlines. Printed Furniture conservator/restorer. Support groups or group counseling. Text messaging programs. Mobile phone apps. Use apps on your mobile phone or tablet that can help you stick to your quit plan. There are many free apps for mobile phones and tablets as well as websites. Examples include Quit Guide from the [employer] and smokefree.gov  What things can I do to make it easier to quit?  Talk to your family and friends. Ask them to support and encourage you. Call a phone quitline (1-800-QUIT-NOW), reach out to support groups, or work with a Social worker. Ask people who smoke to not smoke around you. Avoid places that make you want to smoke, such as: Bars. Parties. Smoke-break areas at work. Spend time with people who do not smoke. Lower the stress in your life. Stress can make you want to  smoke. Try these things to help your stress: Getting regular exercise. Doing deep-breathing exercises. Doing yoga. Meditating. Doing a body scan. To do this, close your eyes, focus on one area of your body at a time from head to toe. Notice which parts of your body are tense. Try to relax the muscles in those areas. How will I feel when I quit smoking? Day 1 to 3 weeks Within the first 24 hours, you may start to have some problems that come from quitting tobacco.  These problems are very bad 2-3 days after you quit, but they do not often last for more than 2-3 weeks. You may get these symptoms: Mood swings. Feeling restless, nervous, angry, or annoyed. Trouble concentrating. Dizziness. Strong desire for high-sugar foods and nicotine. Weight gain. Trouble pooping (constipation). Feeling like you may vomit (nausea). Coughing or a sore throat. Changes in how the medicines that you take for other issues work in your body. Depression. Trouble sleeping (insomnia). Week 3 and afterward After the first 2-3 weeks of quitting, you may start to notice more positive results, such as: Better sense of smell and taste. Less coughing and sore throat. Slower heart rate. Lower blood pressure. Clearer skin. Better breathing. Fewer sick days. Quitting smoking can be hard. Do not give up if you fail the first time. Some people need to try a few times before they succeed. Do your best to stick to your quit plan, and talk with your doctor if you have any questions or concerns. Summary Smoking tobacco is the leading cause of preventable death. Quitting smoking can be hard, but it is one of the best things that you can do for your health. When you decide to quit smoking, make a plan to help you succeed. Quit smoking right away, not slowly over a period of time. When you start quitting, seek help from your doctor, family, or friends. This information is not intended to replace advice given to you by your health care provider. Make sure you discuss any questions you have with your health care provider. Document Revised: 10/08/2020 Document Reviewed: 04/20/2018 Elsevier Patient Education  2022 [employer]. Chronic Obstructive Pulmonary Disease Chronic obstructive pulmonary disease (COPD) is a long-term (chronic) lung problem. When you have COPD, it is hard for air to get in and out of your lungs. Usually the condition gets worse over time, and your lungs will never  return to normal. There are things you can do to keep yourself as healthy as possible. What are the causes? Smoking. This is the most common cause. Certain genes passed from parent to child (inherited). What increases the risk? Being exposed to secondhand smoke from cigarettes, pipes, or cigars. Being exposed to chemicals and other irritants, such as fumes and dust in the work environment. Having chronic lung conditions or infections. What are the signs or symptoms? Shortness of breath, especially during physical activity. A long-term cough with a large amount of thick mucus. Sometimes, the cough may not have any mucus (dry cough). Wheezing. Breathing quickly. Skin that looks gray or blue, especially in the fingers, toes, or lips. Feeling tired (fatigue). Weight loss. Chest tightness. Having infections often. Episodes when breathing symptoms become much worse (exacerbations). At the later stages of this disease, you may have swelling in the ankles, feet, or legs. How is this treated? Taking medicines. Quitting smoking, if you smoke. Rehabilitation. This includes steps to make your body work better. It may involve a team of specialists. Doing exercises. Making changes to  your diet. Using oxygen. Lung surgery. Lung transplant. Comfort measures (palliative care). Follow these instructions at home: Medicines Take over-the-counter and prescription medicines only as told by your doctor. Talk to your doctor before taking any cough or allergy medicines. You may need to avoid medicines that cause your lungs to be dry. Lifestyle If you smoke, stop smoking. Smoking makes the problem worse. Do not smoke or use any products that contain nicotine or tobacco. If you need help quitting, ask your doctor. Avoid being around things that make your breathing worse. This may include smoke, chemicals, and fumes. Stay active, but remember to rest as well. Learn and use tips on how to manage stress and  control your breathing. Make sure you get enough sleep. Most adults need at least 7 hours of sleep every night. Eat healthy foods. Eat smaller meals more often. Rest before meals. Controlled breathing Learn and use tips on how to control your breathing as told by your doctor. Try: Breathing in (inhaling) through your nose for 1 second. Then, pucker your lips and breath out (exhale) through your lips for 2 seconds. Putting one hand on your belly (abdomen). Breathe in slowly through your nose for 1 second. Your hand on your belly should move out. Pucker your lips and breathe out slowly through your lips. Your hand on your belly should move in as you breathe out.  Controlled coughing Learn and use controlled coughing to clear mucus from your lungs. Follow these steps: Lean your head a little forward. Breathe in deeply. Try to hold your breath for 3 seconds. Keep your mouth slightly open while coughing 2 times. Spit any mucus out into a tissue. Rest and do the steps again 1 or 2 times as needed. General instructions Make sure you get all the shots (vaccines) that your doctor recommends. Ask your doctor about a flu shot and a pneumonia shot. Use oxygen therapy and pulmonary rehabilitation if told by your doctor. If you need home oxygen therapy, ask your doctor if you should buy a tool to measure your oxygen level (oximeter). Make a COPD action plan with your doctor. This helps you to know what to do if you feel worse than usual. Manage any other conditions you have as told by your doctor. Avoid going outside when it is very hot, cold, or humid. Avoid people who have a sickness you can catch (contagious). Keep all follow-up visits. Contact a doctor if: You cough up more mucus than usual. There is a change in the color or thickness of the mucus. It is harder to breathe than usual. Your breathing is faster than usual. You have trouble sleeping. You need to use your medicines more often than  usual. You have trouble doing your normal activities such as getting dressed or walking around the house. Get help right away if: You have shortness of breath while resting. You have shortness of breath that stops you from: Being able to talk. Doing normal activities. Your chest hurts for longer than 5 minutes. Your skin color is more blue than usual. Your pulse oximeter shows that you have low oxygen for longer than 5 minutes. You have a fever. You feel too tired to breathe normally. These symptoms may represent a serious problem that is an emergency. Do not wait to see if the symptoms will go away. Get medical help right away. Call your local emergency services (911 in the [place] Do not drive yourself to the hospital. Summary Chronic obstructive pulmonary disease (COPD) is  a long-term lung problem. The way your lungs work will never return to normal. Usually the condition gets worse over time. There are things you can do to keep yourself as healthy as possible. Take over-the-counter and prescription medicines only as told by your doctor. If you smoke, stop. Smoking makes the problem worse. This information is not intended to replace advice given to you by your health care provider. Make sure you discuss any questions you have with your health care provider. Document Revised: 12/09/2019 Document Reviewed: 12/09/2019 Elsevier Patient Education  2022 [employer].

## 2021-10-04 ENCOUNTER — Ambulatory Visit: Payer: Medicare Other | Admitting: Internal Medicine

## 2021-10-05 ENCOUNTER — Ambulatory Visit: Payer: Medicare Other | Admitting: Internal Medicine

## 2021-10-18 ENCOUNTER — Ambulatory Visit: Payer: Medicare Other | Admitting: Internal Medicine

## 2021-10-18 ENCOUNTER — Encounter: Payer: Self-pay | Admitting: Internal Medicine

## 2021-10-18 VITALS — BP 108/70 | HR 69 | Temp 98.0°F | Resp 16 | Ht 61.0 in | Wt 93.8 lb

## 2021-10-18 DIAGNOSIS — J449 Chronic obstructive pulmonary disease, unspecified: Secondary | ICD-10-CM | POA: Diagnosis not present

## 2021-10-18 DIAGNOSIS — F17219 Nicotine dependence, cigarettes, with unspecified nicotine-induced disorders: Secondary | ICD-10-CM

## 2021-10-18 DIAGNOSIS — J301 Allergic rhinitis due to pollen: Secondary | ICD-10-CM | POA: Diagnosis not present

## 2021-10-18 DIAGNOSIS — R0602 Shortness of breath: Secondary | ICD-10-CM

## 2021-10-18 NOTE — Patient Instructions (Signed)
Chronic Obstructive Pulmonary Disease  Chronic obstructive pulmonary disease (COPD) is a long-term (chronic) lung problem. When you have COPD, it is hard for air to get in and out of your lungs. Usually the condition gets worse over time, and your lungs will never return to normal. There are things you can do to keep yourself as healthy as possible. What are the causes? Smoking. This is the most common cause. Certain genes passed from parent to child (inherited). What increases the risk? Being exposed to secondhand smoke from cigarettes, pipes, or cigars. Being exposed to chemicals and other irritants, such as fumes and dust in the work environment. Having chronic lung conditions or infections. What are the signs or symptoms? Shortness of breath, especially during physical activity. A long-term cough with a large amount of thick mucus. Sometimes, the cough may not have any mucus (dry cough). Wheezing. Breathing quickly. Skin that looks gray or blue, especially in the fingers, toes, or lips. Feeling tired (fatigue). Weight loss. Chest tightness. Having infections often. Episodes when breathing symptoms become much worse (exacerbations). At the later stages of this disease, you may have swelling in the ankles, feet, or legs. How is this treated? Taking medicines. Quitting smoking, if you smoke. Rehabilitation. This includes steps to make your body work better. It may involve a team of specialists. Doing exercises. Making changes to your diet. Using oxygen. Lung surgery. Lung transplant. Comfort measures (palliative care). Follow these instructions at home: Medicines Take over-the-counter and prescription medicines only as told by your doctor. Talk to your doctor before taking any cough or allergy medicines. You may need to avoid medicines that cause your lungs to be dry. Lifestyle If you smoke, stop smoking. Smoking makes the problem worse. Do not smoke or use any products that  contain nicotine or tobacco. If you need help quitting, ask your doctor. Avoid being around things that make your breathing worse. This may include smoke, chemicals, and fumes. Stay active, but remember to rest as well. Learn and use tips on how to manage stress and control your breathing. Make sure you get enough sleep. Most adults need at least 7 hours of sleep every night. Eat healthy foods. Eat smaller meals more often. Rest before meals. Controlled breathing Learn and use tips on how to control your breathing as told by your doctor. Try: Breathing in (inhaling) through your nose for 1 second. Then, pucker your lips and breath out (exhale) through your lips for 2 seconds. Putting one hand on your belly (abdomen). Breathe in slowly through your nose for 1 second. Your hand on your belly should move out. Pucker your lips and breathe out slowly through your lips. Your hand on your belly should move in as you breathe out.  Controlled coughing Learn and use controlled coughing to clear mucus from your lungs. Follow these steps: Lean your head a little forward. Breathe in deeply. Try to hold your breath for 3 seconds. Keep your mouth slightly open while coughing 2 times. Spit any mucus out into a tissue. Rest and do the steps again 1 or 2 times as needed. General instructions Make sure you get all the shots (vaccines) that your doctor recommends. Ask your doctor about a flu shot and a pneumonia shot. Use oxygen therapy and pulmonary rehabilitation if told by your doctor. If you need home oxygen therapy, ask your doctor if you should buy a tool to measure your oxygen level (oximeter). Make a COPD action plan with your doctor. This helps you   to know what to do if you feel worse than usual. Manage any other conditions you have as told by your doctor. Avoid going outside when it is very hot, cold, or humid. Avoid people who have a sickness you can catch (contagious). Keep all follow-up  visits. Contact a doctor if: You cough up more mucus than usual. There is a change in the color or thickness of the mucus. It is harder to breathe than usual. Your breathing is faster than usual. You have trouble sleeping. You need to use your medicines more often than usual. You have trouble doing your normal activities such as getting dressed or walking around the house. Get help right away if: You have shortness of breath while resting. You have shortness of breath that stops you from: Being able to talk. Doing normal activities. Your chest hurts for longer than 5 minutes. Your skin color is more blue than usual. Your pulse oximeter shows that you have low oxygen for longer than 5 minutes. You have a fever. You feel too tired to breathe normally. These symptoms may represent a serious problem that is an emergency. Do not wait to see if the symptoms will go away. Get medical help right away. Call your local emergency services (911 in the [place] Do not drive yourself to the hospital. Summary Chronic obstructive pulmonary disease (COPD) is a long-term lung problem. The way your lungs work will never return to normal. Usually the condition gets worse over time. There are things you can do to keep yourself as healthy as possible. Take over-the-counter and prescription medicines only as told by your doctor. If you smoke, stop. Smoking makes the problem worse. This information is not intended to replace advice given to you by your health care provider. Make sure you discuss any questions you have with your health care provider. Document Revised: 12/09/2019 Document Reviewed: 12/09/2019 Elsevier Patient Education  2023 Elsevier Inc. Steps to Quit Smoking Smoking tobacco is the leading cause of preventable death. It can affect almost every organ in the body. Smoking puts you and people around you at risk for many serious, long-lasting (chronic) diseases. Quitting smoking can be hard, but it  is one of the best things that you can do for your health. It is never too late to quit. Do not give up if you cannot quit the first time. Some people need to try many times to quit. Do your best to stick to your quit plan, and talk with your doctor if you have any questions or concerns. How do I get ready to quit? Pick a date to quit. Set a date within the next 2 weeks to give you time to prepare. Write down the reasons why you are quitting. Keep this list in places where you will see it often. Tell your family, friends, and co-workers that you are quitting. Their support is important. Talk with your doctor about the choices that may help you quit. Find out if your health insurance will pay for these treatments. Know the people, places, things, and activities that make you want to smoke (triggers). Avoid them. What first steps can I take to quit smoking? Throw away all cigarettes at home, at work, and in your car. Throw away the things that you use when you smoke, such as ashtrays and lighters. Clean your car. Empty the ashtray. Clean your home, including curtains and carpets. What can I do to help me quit smoking? Talk with your doctor about taking medicines and seeing   a counselor. You are more likely to succeed when you do both. If you are pregnant or breastfeeding: Talk with your doctor about counseling or other ways to quit smoking. Do not take medicine to help you quit smoking unless your doctor tells you to. Quit right away Quit smoking completely, instead of slowly cutting back on how much you smoke over a period of time. Stopping smoking right away may be more successful than slowly quitting. Go to counseling. In-person is best if this is an option. You are more likely to quit if you go to counseling sessions regularly. Take medicine You may take medicines to help you quit. Some medicines need a prescription, and some you can buy over-the-counter. Some medicines may contain a drug  called nicotine to replace the nicotine in cigarettes. Medicines may: Help you stop having the desire to smoke (cravings). Help to stop the problems that come when you stop smoking (withdrawal symptoms). Your doctor may ask you to use: Nicotine patches, gum, or lozenges. Nicotine inhalers or sprays. Non-nicotine medicine that you take by mouth. Find resources Find resources and other ways to help you quit smoking and remain smoke-free after you quit. They include: Online chats with a counselor. Phone quitlines. Printed self-help materials. Support groups or group counseling. Text messaging programs. Mobile phone apps. Use apps on your mobile phone or tablet that can help you stick to your quit plan. Examples of free services include Quit Guide from the CDC and smokefree.gov  What can I do to make it easier to quit?  Talk to your family and friends. Ask them to support and encourage you. Call a phone quitline, such as 1-800-QUIT-NOW, reach out to support groups, or work with a counselor. Ask people who smoke to not smoke around you. Avoid places that make you want to smoke, such as: Bars. Parties. Smoke-break areas at work. Spend time with people who do not smoke. Lower the stress in your life. Stress can make you want to smoke. Try these things to lower stress: Getting regular exercise. Doing deep-breathing exercises. Doing yoga. Meditating. What benefits will I see if I quit smoking? Over time, you may have: A better sense of smell and taste. Less coughing and sore throat. A slower heart rate. Lower blood pressure. Clearer skin. Better breathing. Fewer sick days. Summary Quitting smoking can be hard, but it is one of the best things that you can do for your health. Do not give up if you cannot quit the first time. Some people need to try many times to quit. When you decide to quit smoking, make a plan to help you succeed. Quit smoking right away, not slowly over a period  of time. When you start quitting, get help and support to keep you smoke-free. This information is not intended to replace advice given to you by your health care provider. Make sure you discuss any questions you have with your health care provider. Document Revised: 01/21/2021 Document Reviewed: 01/21/2021 Elsevier Patient Education  2023 Elsevier Inc.  

## 2021-10-18 NOTE — Progress Notes (Signed)
[facility] [address] [place], [place] [address]  Pulmonary Sleep Medicine   Office Visit Note  Patient Name: Alice Sanders DOB: 1955-12-04 MRN [id number]  Date of Service: 10/18/2021  Complaints/HPI: She states her breathing is doing well. She is on trelegy and rescue with ventolin. No admissions since last visist. She is unfortunately still smoking. WE discussed smoking cessation. She had a CT chest screen suggested a follow up in 6 months it has been a year so will schedule this  ROS  General: (-) fever, (-) chills, (-) night sweats, (-) weakness Skin: (-) rashes, (-) itching,. Eyes: (-) visual changes, (-) redness, (-) itching. Nose and Sinuses: (-) nasal stuffiness or itchiness, (-) postnasal drip, (-) nosebleeds, (-) sinus trouble. Mouth and Throat: (-) sore throat, (-) hoarseness. Neck: (-) swollen glands, (-) enlarged thyroid, (-) neck pain. Respiratory: - cough, (-) bloody sputum, + shortness of breath, - wheezing. Cardiovascular: - ankle swelling, (-) chest pain. Lymphatic: (-) lymph node enlargement. Neurologic: (-) numbness, (-) tingling. Psychiatric: (-) anxiety, (-) depression   Current Medication: Outpatient Encounter Medications as of 10/18/2021  Medication Sig   aspirin EC 81 MG tablet Take 81 mg by mouth daily.   benzonatate (TESSALON) 100 MG capsule Take 1-2 capsules (100-200 mg total) by mouth 2 (two) times daily as needed for cough.   ergocalciferol (VITAMIN D2) 1.25 MG (50000 UT) capsule Take 50,000 Units by mouth once a week.   estradiol-norethindrone (ACTIVELLA) 1-0.5 MG tablet    Fluticasone-Umeclidin-Vilant 100-62.5-25 MCG/INH AEPB Inhale into the lungs.   levothyroxine (SYNTHROID, LEVOTHROID) 25 MCG tablet Take 25 mcg by mouth daily before breakfast.   metoprolol succinate (TOPROL-XL) 50 MG 24 hr tablet Take 50 mg by mouth daily. Take with or immediately following a meal.   simvastatin (ZOCOR) 20 MG tablet Take 20 mg by mouth daily.    VENTOLIN HFA 108 (90 Base) MCG/ACT inhaler    No facility-administered encounter medications on file as of 10/18/2021.    Surgical History: Past Surgical History:  Procedure Laterality Date   ABDOMINAL HYSTERECTOMY     CARPAL TUNNEL RELEASE  2004   TENNIS ELBOW RELEASE/NIRSCHEL PROCEDURE  2006    Medical History: Past Medical History:  Diagnosis Date   Chronic bronchitis (HCC)    Cough    Smoker     Family History: Family History  Problem Relation Age of Onset   Hypertension Mother    Hyperlipidemia Mother    Hypertension Father    Hyperlipidemia Father    Hypertension Brother    Hyperlipidemia Brother     Social History: Social History   Socioeconomic History   Marital status: Married    Spouse name: Not on file   Number of children: Not on file   Years of education: Not on file   Highest education level: Not on file  Occupational History   Not on file  Tobacco Use   Smoking status: Every Day    Packs/day: 0.75    Years: 47.00    Total pack years: 35.25    Types: Cigarettes   Smokeless tobacco: Never  Vaping Use   Vaping Use: Never used  Substance and Sexual Activity   Alcohol use: Never   Drug use: Never   Sexual activity: Not on file  Other Topics Concern   Not on file  Social History Narrative   Not on file   Social Determinants of Health   Financial Resource Strain: Not on file  Food Insecurity: Not  on file  Transportation Needs: Not on file  Physical Activity: Not on file  Stress: Not on file  Social Connections: Not on file  Intimate Partner Violence: Not on file    Vital Signs: Blood pressure 108/70, pulse 69, temperature 98 F (36.7 C), resp. rate 16, height 5\' 1"  (1.549 m), weight 93 lb 12.8 oz (42.5 kg), SpO2 94 %.  Examination: General Appearance: The patient is well-developed, well-nourished, and in no distress. Skin: Gross inspection of skin unremarkable. Head: normocephalic, no gross deformities. Eyes: no gross deformities  noted. ENT: ears appear grossly normal no exudates. Neck: Supple. No thyromegaly. No LAD. Respiratory: no rhonchi noted. Cardiovascular: Normal S1 and S2 without murmur or rub. Extremities: No cyanosis. pulses are equal. Neurologic: Alert and oriented. No involuntary movements.  LABS: No results found for this or any previous visit (from the past 2160 hour(s)).  Radiology: CT CHEST LUNG CANCER SCREENING LOW DOSE WO CONTRAST  Result Date: 04/11/2020 CLINICAL DATA:  66 year old female current smoker with 35 pack-year history of smoking. Lung cancer screening examination. EXAM: CT CHEST WITHOUT CONTRAST LOW-DOSE FOR LUNG CANCER SCREENING TECHNIQUE: Multidetector CT imaging of the chest was performed following the standard protocol without IV contrast. COMPARISON:  No priors. FINDINGS: Cardiovascular: Heart size is normal. There is no significant pericardial fluid, thickening or pericardial calcification. Aortic atherosclerosis. No definite coronary artery calcifications. Mediastinum/Nodes: No pathologically enlarged mediastinal or hilar lymph nodes. Please note that accurate exclusion of hilar adenopathy is limited on noncontrast CT scans. Esophagus is unremarkable in appearance. No axillary lymphadenopathy. Lungs/Pleura: Multiple pulmonary nodules are noted throughout both lungs, largest of which is in the medial aspect of the right upper lobe (axial image 69 of series 3), with a volume derived mean diameter of 6.5 mm. No other larger more suspicious appearing pulmonary nodules or masses are noted. No acute consolidative airspace disease. No pleural effusions. Diffuse bronchial wall thickening with mild centrilobular and paraseptal emphysema. Upper Abdomen: 1.9 x 1.3 cm intermediate attenuation lesion in segment 2 of the liver (axial image 54 of series 2), incompletely characterized on today's noncontrast CT examination. Musculoskeletal: There are no aggressive appearing lytic or blastic lesions noted in  the visualized portions of the skeleton. IMPRESSION: 1. Lung-RADS 3, probably benign findings. Short-term follow-up in 6 months is recommended with repeat low-dose chest CT without contrast (please use the following order, "CT CHEST LCS NODULE FOLLOW-UP W/O CM"). 2. Aortic atherosclerosis. 3. Mild diffuse bronchial wall thickening with mild centrilobular and paraseptal emphysema; imaging findings suggestive of underlying COPD. 4. Intermediate attenuation lesion measuring 1.9 x 1.3 cm in segment 2 of the liver, incompletely characterized on today's noncontrast CT examination. Further evaluation with nonemergent abdominal MRI with and without IV gadolinium is recommended in the near future to provide definitive characterization. These results will be called to the ordering clinician or representative by the Radiologist Assistant, and communication documented in the PACS or 64. Aortic Atherosclerosis (ICD10-I70.0) and Emphysema (ICD10-J43.9). Electronically Signed   By: [employer] M.D.   On: 04/11/2020 08:10    No results found.  No results found.    Assessment and Plan: Patient Active Problem List   Diagnosis Date Noted   Cough    Smoker     1. SOB (shortness of breath) She is going to continue to have issues with breathing she unfortunately continues to smoke we had very long conversations on multiple occasions about smoking cessation unfortunately she is not stopping cigarettes - Spirometry with Graph  2.  Obstructive chronic bronchitis without exacerbation (HCC) I discussed with her the importance of smoking cessation and how this could prevent her from having any severe progression COPD.  Currently she is on albuterol this does seem to help her with her day-to-day symptoms.  3. Cigarette nicotine dependence with nicotine-induced disorder Smoking cessation counseling was provided in addition we will get a CT scan of the chest for follow-up of low-dose scan of pulmonary  nodules.  4. Seasonal allergic rhinitis due to pollen We will continue with supportive care   General Counseling: I have discussed the findings of the evaluation and examination with Velna Hatchet.  I have also discussed any further diagnostic evaluation thatmay be needed or ordered today. Shanielle verbalizes understanding of the findings of todays visit. We also reviewed her medications today and discussed drug interactions and side effects including but not limited excessive drowsiness and altered mental states. We also discussed that there is always a risk not just to her but also people around her. she has been encouraged to call the office with any questions or concerns that should arise related to todays visit.  Orders Placed This Encounter  Procedures   Spirometry with Graph    Order Specific Question:   Where should this test be performed?    Answer:   Other     Time spent: 32  I have personally obtained a history, examined the patient, evaluated laboratory and imaging results, formulated the assessment and plan and placed orders.    Yevonne Pax, MD [facility] Pulmonary and Critical Care Sleep medicine

## 2021-10-28 ENCOUNTER — Ambulatory Visit
Admission: RE | Admit: 2021-10-28 | Discharge: 2021-10-28 | Disposition: A | Discharge status: Home / Self Care | Payer: Medicare Other | Source: Ambulatory Visit | Attending: Internal Medicine | Admitting: Internal Medicine

## 2021-10-28 DIAGNOSIS — I2510 Atherosclerotic heart disease of native coronary artery without angina pectoris: Secondary | ICD-10-CM | POA: Diagnosis not present

## 2021-10-28 DIAGNOSIS — J439 Emphysema, unspecified: Secondary | ICD-10-CM | POA: Diagnosis not present

## 2021-10-28 DIAGNOSIS — I700 Atherosclerosis of aorta: Secondary | ICD-10-CM | POA: Insufficient documentation

## 2021-10-28 DIAGNOSIS — J301 Allergic rhinitis due to pollen: Secondary | ICD-10-CM | POA: Diagnosis present

## 2021-10-31 ENCOUNTER — Telehealth: Payer: Self-pay

## 2021-10-31 NOTE — Telephone Encounter (Signed)
Lmom to call back pt regarding pt need appt for ct follow up DSK

## 2021-11-01 ENCOUNTER — Ambulatory Visit: Payer: Medicare Other | Admitting: Internal Medicine

## 2021-11-07 ENCOUNTER — Ambulatory Visit: Payer: Medicare Other | Admitting: Nurse Practitioner

## 2021-11-08 ENCOUNTER — Ambulatory Visit: Payer: Medicare Other | Admitting: Internal Medicine

## 2021-11-08 ENCOUNTER — Encounter: Payer: Self-pay | Admitting: Internal Medicine

## 2021-11-08 VITALS — BP 126/82 | HR 69 | Temp 97.9°F | Resp 16 | Ht 61.0 in | Wt 94.8 lb

## 2021-11-08 DIAGNOSIS — F17219 Nicotine dependence, cigarettes, with unspecified nicotine-induced disorders: Secondary | ICD-10-CM

## 2021-11-08 DIAGNOSIS — J449 Chronic obstructive pulmonary disease, unspecified: Secondary | ICD-10-CM | POA: Diagnosis not present

## 2021-11-08 DIAGNOSIS — R918 Other nonspecific abnormal finding of lung field: Secondary | ICD-10-CM

## 2021-11-08 DIAGNOSIS — J4489 Other specified chronic obstructive pulmonary disease: Secondary | ICD-10-CM

## 2021-11-08 NOTE — Patient Instructions (Signed)
Steps to Quit Smoking Smoking tobacco is the leading cause of preventable death. It can affect almost every organ in the body. Smoking puts you and people around you at risk for many serious, long-lasting (chronic) diseases. Quitting smoking can be hard, but it is one of the best things that you can do for your health. It is never too late to quit. Do not give up if you cannot quit the first time. Some people need to try many times to quit. Do your best to stick to your quit plan, and talk with your doctor if you have any questions or concerns. How do I get ready to quit? Pick a date to quit. Set a date within the next 2 weeks to give you time to prepare. Write down the reasons why you are quitting. Keep this list in places where you will see it often. Tell your family, friends, and co-workers that you are quitting. Their support is important. Talk with your doctor about the choices that may help you quit. Find out if your health insurance will pay for these treatments. Know the people, places, things, and activities that make you want to smoke (triggers). Avoid them. What first steps can I take to quit smoking? Throw away all cigarettes at home, at work, and in your car. Throw away the things that you use when you smoke, such as ashtrays and lighters. Clean your car. Empty the ashtray. Clean your home, including curtains and carpets. What can I do to help me quit smoking? Talk with your doctor about taking medicines and seeing a counselor. You are more likely to succeed when you do both. If you are pregnant or breastfeeding: Talk with your doctor about counseling or other ways to quit smoking. Do not take medicine to help you quit smoking unless your doctor tells you to. Quit right away Quit smoking completely, instead of slowly cutting back on how much you smoke over a period of time. Stopping smoking right away may be more successful than slowly quitting. Go to counseling. In-person is best  if this is an option. You are more likely to quit if you go to counseling sessions regularly. Take medicine You may take medicines to help you quit. Some medicines need a prescription, and some you can buy over-the-counter. Some medicines may contain a drug called nicotine to replace the nicotine in cigarettes. Medicines may: Help you stop having the desire to smoke (cravings). Help to stop the problems that come when you stop smoking (withdrawal symptoms). Your doctor may ask you to use: Nicotine patches, gum, or lozenges. Nicotine inhalers or sprays. Non-nicotine medicine that you take by mouth. Find resources Find resources and other ways to help you quit smoking and remain smoke-free after you quit. They include: Online chats with a counselor. Phone quitlines. Printed self-help materials. Support groups or group counseling. Text messaging programs. Mobile phone apps. Use apps on your mobile phone or tablet that can help you stick to your quit plan. Examples of free services include Quit Guide from the CDC and smokefree.gov  What can I do to make it easier to quit?  Talk to your family and friends. Ask them to support and encourage you. Call a phone quitline, such as 1-800-QUIT-NOW, reach out to support groups, or work with a counselor. Ask people who smoke to not smoke around you. Avoid places that make you want to smoke, such as: Bars. Parties. Smoke-break areas at work. Spend time with people who do not smoke. Lower   the stress in your life. Stress can make you want to smoke. Try these things to lower stress: Getting regular exercise. Doing deep-breathing exercises. Doing yoga. Meditating. What benefits will I see if I quit smoking? Over time, you may have: A better sense of smell and taste. Less coughing and sore throat. A slower heart rate. Lower blood pressure. Clearer skin. Better breathing. Fewer sick days. Summary Quitting smoking can be hard, but it is one of  the best things that you can do for your health. Do not give up if you cannot quit the first time. Some people need to try many times to quit. When you decide to quit smoking, make a plan to help you succeed. Quit smoking right away, not slowly over a period of time. When you start quitting, get help and support to keep you smoke-free. This information is not intended to replace advice given to you by your health care provider. Make sure you discuss any questions you have with your health care provider. Document Revised: 01/21/2021 Document Reviewed: 01/21/2021 Elsevier Patient Education  2023 Elsevier Inc. Chronic Obstructive Pulmonary Disease  Chronic obstructive pulmonary disease (COPD) is a long-term (chronic) lung problem. When you have COPD, it is hard for air to get in and out of your lungs. Usually the condition gets worse over time, and your lungs will never return to normal. There are things you can do to keep yourself as healthy as possible. What are the causes? Smoking. This is the most common cause. Certain genes passed from parent to child (inherited). What increases the risk? Being exposed to secondhand smoke from cigarettes, pipes, or cigars. Being exposed to chemicals and other irritants, such as fumes and dust in the work environment. Having chronic lung conditions or infections. What are the signs or symptoms? Shortness of breath, especially during physical activity. A long-term cough with a large amount of thick mucus. Sometimes, the cough may not have any mucus (dry cough). Wheezing. Breathing quickly. Skin that looks gray or blue, especially in the fingers, toes, or lips. Feeling tired (fatigue). Weight loss. Chest tightness. Having infections often. Episodes when breathing symptoms become much worse (exacerbations). At the later stages of this disease, you may have swelling in the ankles, feet, or legs. How is this treated? Taking medicines. Quitting smoking,  if you smoke. Rehabilitation. This includes steps to make your body work better. It may involve a team of specialists. Doing exercises. Making changes to your diet. Using oxygen. Lung surgery. Lung transplant. Comfort measures (palliative care). Follow these instructions at home: Medicines Take over-the-counter and prescription medicines only as told by your doctor. Talk to your doctor before taking any cough or allergy medicines. You may need to avoid medicines that cause your lungs to be dry. Lifestyle If you smoke, stop smoking. Smoking makes the problem worse. Do not smoke or use any products that contain nicotine or tobacco. If you need help quitting, ask your doctor. Avoid being around things that make your breathing worse. This may include smoke, chemicals, and fumes. Stay active, but remember to rest as well. Learn and use tips on how to manage stress and control your breathing. Make sure you get enough sleep. Most adults need at least 7 hours of sleep every night. Eat healthy foods. Eat smaller meals more often. Rest before meals. Controlled breathing Learn and use tips on how to control your breathing as told by your doctor. Try: Breathing in (inhaling) through your nose for 1 second. Then, pucker your  lips and breath out (exhale) through your lips for 2 seconds. Putting one hand on your belly (abdomen). Breathe in slowly through your nose for 1 second. Your hand on your belly should move out. Pucker your lips and breathe out slowly through your lips. Your hand on your belly should move in as you breathe out.  Controlled coughing Learn and use controlled coughing to clear mucus from your lungs. Follow these steps: Lean your head a little forward. Breathe in deeply. Try to hold your breath for 3 seconds. Keep your mouth slightly open while coughing 2 times. Spit any mucus out into a tissue. Rest and do the steps again 1 or 2 times as needed. General instructions Make sure  you get all the shots (vaccines) that your doctor recommends. Ask your doctor about a flu shot and a pneumonia shot. Use oxygen therapy and pulmonary rehabilitation if told by your doctor. If you need home oxygen therapy, ask your doctor if you should buy a tool to measure your oxygen level (oximeter). Make a COPD action plan with your doctor. This helps you to know what to do if you feel worse than usual. Manage any other conditions you have as told by your doctor. Avoid going outside when it is very hot, cold, or humid. Avoid people who have a sickness you can catch (contagious). Keep all follow-up visits. Contact a doctor if: You cough up more mucus than usual. There is a change in the color or thickness of the mucus. It is harder to breathe than usual. Your breathing is faster than usual. You have trouble sleeping. You need to use your medicines more often than usual. You have trouble doing your normal activities such as getting dressed or walking around the house. Get help right away if: You have shortness of breath while resting. You have shortness of breath that stops you from: Being able to talk. Doing normal activities. Your chest hurts for longer than 5 minutes. Your skin color is more blue than usual. Your pulse oximeter shows that you have low oxygen for longer than 5 minutes. You have a fever. You feel too tired to breathe normally. These symptoms may represent a serious problem that is an emergency. Do not wait to see if the symptoms will go away. Get medical help right away. Call your local emergency services (911 in the [place] Do not drive yourself to the hospital. Summary Chronic obstructive pulmonary disease (COPD) is a long-term lung problem. The way your lungs work will never return to normal. Usually the condition gets worse over time. There are things you can do to keep yourself as healthy as possible. Take over-the-counter and prescription medicines only as told by  your doctor. If you smoke, stop. Smoking makes the problem worse. This information is not intended to replace advice given to you by your health care provider. Make sure you discuss any questions you have with your health care provider. Document Revised: 12/09/2019 Document Reviewed: 12/09/2019 Elsevier Patient Education  [place].

## 2021-11-08 NOTE — Progress Notes (Signed)
[facility] [place], [place] [address]  Pulmonary Sleep Medicine   Office Visit Note  Patient Name: Alice Sanders DOB: 1955/05/25 MRN [id number]  Date of Service: 11/08/2021  Complaints/HPI: CT chest reviewed. Nodule noted PET needed patient had a nodule noted on the CT scan I discussed the findings with her.  Explained to her that there is a possibility that this could be malignancy.  Need to get a further imaging with a PET scan to delineate whether there is increased uptake.  If there is then we will talk about the best procedure to do biopsy if it is necessary.  ROS  General: (-) fever, (-) chills, (-) night sweats, (-) weakness Skin: (-) rashes, (-) itching,. Eyes: (-) visual changes, (-) redness, (-) itching. Nose and Sinuses: (-) nasal stuffiness or itchiness, (-) postnasal drip, (-) nosebleeds, (-) sinus trouble. Mouth and Throat: (-) sore throat, (-) hoarseness. Neck: (-) swollen glands, (-) enlarged thyroid, (-) neck pain. Respiratory: - cough, (-) bloody sputum, - shortness of breath, - wheezing. Cardiovascular: - ankle swelling, (-) chest pain. Lymphatic: (-) lymph node enlargement. Neurologic: (-) numbness, (-) tingling. Psychiatric: (-) anxiety, (-) depression   Current Medication: Outpatient Encounter Medications as of 11/08/2021  Medication Sig   aspirin EC 81 MG tablet Take 81 mg by mouth daily.   ergocalciferol (VITAMIN D2) 1.25 MG (50000 UT) capsule Take 50,000 Units by mouth once a week.   estradiol-norethindrone (ACTIVELLA) 1-0.5 MG tablet    Fluticasone-Umeclidin-Vilant 100-62.5-25 MCG/INH AEPB Inhale into the lungs.   levothyroxine (SYNTHROID, LEVOTHROID) 25 MCG tablet Take 25 mcg by mouth daily before breakfast.   metoprolol succinate (TOPROL-XL) 50 MG 24 hr tablet Take 50 mg by mouth daily. Take with or immediately following a meal.   simvastatin (ZOCOR) 20 MG tablet Take 20 mg by mouth daily.   VENTOLIN HFA 108 (90 Base)  MCG/ACT inhaler    benzonatate (TESSALON) 100 MG capsule Take 1-2 capsules (100-200 mg total) by mouth 2 (two) times daily as needed for cough. (Patient not taking: Reported on 11/08/2021)   No facility-administered encounter medications on file as of 11/08/2021.    Surgical History: Past Surgical History:  Procedure Laterality Date   ABDOMINAL HYSTERECTOMY     CARPAL TUNNEL RELEASE  2004   TENNIS ELBOW RELEASE/NIRSCHEL PROCEDURE  2006    Medical History: Past Medical History:  Diagnosis Date   Chronic bronchitis (HCC)    Cough    Smoker     Family History: Family History  Problem Relation Age of Onset   Hypertension Mother    Hyperlipidemia Mother    Hypertension Father    Hyperlipidemia Father    Hypertension Brother    Hyperlipidemia Brother     Social History: Social History   Socioeconomic History   Marital status: Married    Spouse name: Not on file   Number of children: Not on file   Years of education: Not on file   Highest education level: Not on file  Occupational History   Not on file  Tobacco Use   Smoking status: Every Day    Packs/day: 0.50    Years: 47.00    Total pack years: 23.50    Types: Cigarettes   Smokeless tobacco: Never  Vaping Use   Vaping Use: Never used  Substance and Sexual Activity   Alcohol use: Never   Drug use: Never   Sexual activity: Not on file  Other Topics Concern   Not on  file  Social History Narrative   Not on file   Social Determinants of Health   Financial Resource Strain: Not on file  Food Insecurity: Not on file  Transportation Needs: Not on file  Physical Activity: Not on file  Stress: Not on file  Social Connections: Not on file  Intimate Partner Violence: Not on file    Vital Signs: Blood pressure 126/82, pulse 69, temperature 97.9 F (36.6 C), resp. rate 16, height 5\' 1"  (1.549 m), weight 94 lb 12.8 oz (43 kg), SpO2 100 %.  Examination: General Appearance: The patient is well-developed,  well-nourished, and in no distress. Skin: Gross inspection of skin unremarkable. Head: normocephalic, no gross deformities. Eyes: no gross deformities noted. ENT: ears appear grossly normal no exudates. Neck: Supple. No thyromegaly. No LAD. Respiratory: few rhonchi are heard. Cardiovascular: Normal S1 and S2 without murmur or rub. Extremities: No cyanosis. pulses are equal. Neurologic: Alert and oriented. No involuntary movements.  LABS: No results found for this or any previous visit (from the past 2160 hour(s)).  Radiology: CT CHEST LCS NODULE F/U LOW DOSE WO CONTRAST  Result Date: 10/30/2021 CLINICAL DATA:  Follow-up lung nodule. Lung cancer screening. Current smoker. Thirty-five pack-year history. Asymptomatic. EXAM: CT CHEST WITHOUT CONTRAST FOR LUNG CANCER SCREENING NODULE FOLLOW-UP TECHNIQUE: Multidetector CT imaging of the chest was performed following the standard protocol without IV contrast. RADIATION DOSE REDUCTION: This exam was performed according to the departmental dose-optimization program which includes automated exposure control, adjustment of the mA and/or kV according to patient size and/or use of iterative reconstruction technique. COMPARISON:  04/09/2020 FINDINGS: Cardiovascular: Normal heart size. No pericardial effusion. Mild aortic atherosclerosis. Lad coronary artery calcification identified, image 72/5. Mediastinum/Nodes: No enlarged mediastinal, hilar, or axillary lymph nodes. Thyroid gland, trachea, and esophagus demonstrate no significant findings. Lungs/Pleura: Moderate to severe changes of emphysema. No pleural effusion or airspace consolidation. The previously noted lung nodules are stable in the interval. There are multiple new lung nodules on today's exam. The most severe is in the periphery of the left upper lobe which has a mean derived diameter of 8.4 mm, image 72/3 Upper Abdomen: No acute abnormality. Within the lateral left lobe of liver there is a stable  low-density structure measuring 1.8 by 1.4 cm, image 54/2. Given the stability of this lesion since 04/09/2020 this is favored to represent a benign lesion such as hemangioma. Musculoskeletal: No chest wall mass or suspicious bone lesions identified. IMPRESSION: 1. Lung-RADS 4B, suspicious. Additional imaging evaluation or consultation with Pulmonology or Thoracic Surgery recommended. Left upper lobe, 8.4 mm, image 72/3. 2. Aortic Atherosclerosis (ICD10-I70.0) and Emphysema (ICD10-J43.9). 3. Coronary artery calcification Electronically Signed   By: 04/11/2020 M.D.   On: 10/30/2021 13:56    No results found.  CT CHEST LCS NODULE F/U LOW DOSE WO CONTRAST  Result Date: 10/30/2021 CLINICAL DATA:  Follow-up lung nodule. Lung cancer screening. Current smoker. Thirty-five pack-year history. Asymptomatic. EXAM: CT CHEST WITHOUT CONTRAST FOR LUNG CANCER SCREENING NODULE FOLLOW-UP TECHNIQUE: Multidetector CT imaging of the chest was performed following the standard protocol without IV contrast. RADIATION DOSE REDUCTION: This exam was performed according to the departmental dose-optimization program which includes automated exposure control, adjustment of the mA and/or kV according to patient size and/or use of iterative reconstruction technique. COMPARISON:  04/09/2020 FINDINGS: Cardiovascular: Normal heart size. No pericardial effusion. Mild aortic atherosclerosis. Lad coronary artery calcification identified, image 72/5. Mediastinum/Nodes: No enlarged mediastinal, hilar, or axillary lymph nodes. Thyroid gland, trachea, and esophagus demonstrate no  significant findings. Lungs/Pleura: Moderate to severe changes of emphysema. No pleural effusion or airspace consolidation. The previously noted lung nodules are stable in the interval. There are multiple new lung nodules on today's exam. The most severe is in the periphery of the left upper lobe which has a mean derived diameter of 8.4 mm, image 72/3 Upper Abdomen: No  acute abnormality. Within the lateral left lobe of liver there is a stable low-density structure measuring 1.8 by 1.4 cm, image 54/2. Given the stability of this lesion since 04/09/2020 this is favored to represent a benign lesion such as hemangioma. Musculoskeletal: No chest wall mass or suspicious bone lesions identified. IMPRESSION: 1. Lung-RADS 4B, suspicious. Additional imaging evaluation or consultation with Pulmonology or Thoracic Surgery recommended. Left upper lobe, 8.4 mm, image 72/3. 2. Aortic Atherosclerosis (ICD10-I70.0) and Emphysema (ICD10-J43.9). 3. Coronary artery calcification Electronically Signed   By: Signa Kell M.D.   On: 10/30/2021 13:56      Assessment and Plan: Patient Active Problem List   Diagnosis Date Noted   Cough    Smoker     1. Mass of left lung We will get PET scan scheduled to get a better idea of whether there is a risk of malignancy in this case - NM PET Image Initial (PI) Skull Base To Thigh (F-18 FDG); Future  2. Obstructive chronic bronchitis without exacerbation (HCC) Patient needs to be on inhalers we will continue to follow along closely.  3. Cigarette nicotine dependence with nicotine-induced disorder Smoking cessation once again discussed at length patient needs to absolutely stop smoking.  General Counseling: I have discussed the findings of the evaluation and examination with Alice Sanders.  I have also discussed any further diagnostic evaluation thatmay be needed or ordered today. Alice Sanders verbalizes understanding of the findings of todays visit. We also reviewed her medications today and discussed drug interactions and side effects including but not limited excessive drowsiness and altered mental states. We also discussed that there is always a risk not just to her but also people around her. she has been encouraged to call the office with any questions or concerns that should arise related to todays visit.  No orders of the defined types were placed  in this encounter.    Time spent: 77  I have personally obtained a history, examined the patient, evaluated laboratory and imaging results, formulated the assessment and plan and placed orders.    Yevonne Pax, MD [facility] Pulmonary and Critical Care Sleep medicine

## 2021-11-11 ENCOUNTER — Telehealth: Payer: Self-pay | Admitting: Internal Medicine

## 2021-11-11 NOTE — Telephone Encounter (Signed)
Clinicals faxed to [facility] for PET authorization

## 2021-11-17 ENCOUNTER — Encounter
Admission: RE | Admit: 2021-11-17 | Discharge: 2021-11-17 | Disposition: A | Discharge status: Home / Self Care | Payer: Medicare Other | Source: Ambulatory Visit | Attending: Internal Medicine | Admitting: Internal Medicine

## 2021-11-17 DIAGNOSIS — R918 Other nonspecific abnormal finding of lung field: Secondary | ICD-10-CM | POA: Diagnosis present

## 2021-11-17 LAB — GLUCOSE, CAPILLARY: Glucose-Capillary: 57 mg/dL — ABNORMAL LOW (ref 70–99)

## 2021-11-17 MED ORDER — FLUDEOXYGLUCOSE F - 18 (FDG) INJECTION
5.2000 | Freq: Once | INTRAVENOUS | Status: AC | PRN
  Administered 2021-11-17: 5.65 via INTRAVENOUS

## 2021-11-23 ENCOUNTER — Encounter: Payer: Self-pay | Admitting: Nurse Practitioner

## 2021-11-23 ENCOUNTER — Ambulatory Visit: Payer: Medicare Other | Admitting: Nurse Practitioner

## 2021-11-23 VITALS — BP 103/59 | HR 80 | Temp 96.9°F | Resp 16 | Ht 61.0 in | Wt 92.0 lb

## 2021-11-23 DIAGNOSIS — J432 Centrilobular emphysema: Secondary | ICD-10-CM | POA: Diagnosis not present

## 2021-11-23 DIAGNOSIS — F17219 Nicotine dependence, cigarettes, with unspecified nicotine-induced disorders: Secondary | ICD-10-CM

## 2021-11-23 DIAGNOSIS — R918 Other nonspecific abnormal finding of lung field: Secondary | ICD-10-CM

## 2021-11-23 NOTE — Progress Notes (Signed)
[facility] [address] [place], [place] [address]  Internal MEDICINE-walking all the way back there yet Office Visit Note  Patient Name: Alice Sanders  [id number]  [id number]  Date of Service: 11/23/2021  Chief Complaint  Patient presents with   Follow-up    Follow up review PET.    HPI Bridgit presents for a follow up visit to review PET scan results.   PET scan results --the scan was done due to changes noted on her CT chest in September with multiple new lung nodules.  On the PET scan, some of the subcentimeter pulmonary nodules had mild FDG uptake but none of the nodules were hypermetabolic.  No additional lesions or areas of hypermetabolic activity were noted on the PET scan.  Incidental findings include aortic atherosclerosis and moderate to severe emphysema. COPD/emphysema --denies any significant change in respiratory status.  Denies shortness of breath, cough, chest tightness or wheezing.  Has an albuterol rescue inhaler that she uses as needed.  Takes Trelegy Ellipta once daily for maintenance.  No refills needed. Every day smoker, 1/2 ppd; 35 pack years. Not ready to quit.    Current Medication: Outpatient Encounter Medications as of 11/23/2021  Medication Sig   aspirin EC 81 MG tablet Take 81 mg by mouth daily.   benzonatate (TESSALON) 100 MG capsule Take 1-2 capsules (100-200 mg total) by mouth 2 (two) times daily as needed for cough.   ergocalciferol (VITAMIN D2) 1.25 MG (50000 UT) capsule Take 50,000 Units by mouth once a week.   estradiol-norethindrone (ACTIVELLA) 1-0.5 MG tablet    Fluticasone-Umeclidin-Vilant 100-62.5-25 MCG/INH AEPB Inhale into the lungs.   levothyroxine (SYNTHROID, LEVOTHROID) 25 MCG tablet Take 25 mcg by mouth daily before breakfast.   metoprolol succinate (TOPROL-XL) 50 MG 24 hr tablet Take 50 mg by mouth daily. Take with or immediately following a meal.   simvastatin (ZOCOR) 20 MG tablet Take 20 mg by mouth daily.   VENTOLIN  HFA 108 (90 Base) MCG/ACT inhaler    No facility-administered encounter medications on file as of 11/23/2021.    Surgical History: Past Surgical History:  Procedure Laterality Date   ABDOMINAL HYSTERECTOMY     CARPAL TUNNEL RELEASE  2004   TENNIS ELBOW RELEASE/NIRSCHEL PROCEDURE  2006    Medical History: Past Medical History:  Diagnosis Date   Chronic bronchitis (HCC)    Cough    Smoker     Family History: Family History  Problem Relation Age of Onset   Hypertension Mother    Hyperlipidemia Mother    Hypertension Father    Hyperlipidemia Father    Hypertension Brother    Hyperlipidemia Brother     Social History   Socioeconomic History   Marital status: Married    Spouse name: Not on file   Number of children: Not on file   Years of education: Not on file   Highest education level: Not on file  Occupational History   Not on file  Tobacco Use   Smoking status: Every Day    Packs/day: 0.50    Years: 47.00    Total pack years: 23.50    Types: Cigarettes   Smokeless tobacco: Never  Vaping Use   Vaping Use: Never used  Substance and Sexual Activity   Alcohol use: Never   Drug use: Never   Sexual activity: Not on file  Other Topics Concern   Not on file  Social History Narrative   Not on file   Social Determinants of  Health   Financial Resource Strain: Not on file  Food Insecurity: Not on file  Transportation Needs: Not on file  Physical Activity: Not on file  Stress: Not on file  Social Connections: Not on file  Intimate Partner Violence: Not on file      Review of Systems  Constitutional: Negative.  Negative for fatigue.  HENT: Negative.    Respiratory: Negative.  Negative for cough, chest tightness, shortness of breath and wheezing.   Cardiovascular: Negative.  Negative for chest pain and palpitations.  Gastrointestinal: Negative.   Neurological: Negative.     Vital Signs: BP (!) 103/59   Pulse 80   Temp (!) 96.9 F (36.1 C)   Resp  16   Ht 5\' 1"  (1.549 m)   Wt 92 lb (41.7 kg)   SpO2 99%   BMI 17.38 kg/m    Physical Exam Vitals reviewed.  Constitutional:      General: She is not in acute distress.    Appearance: Normal appearance. She is not ill-appearing.  HENT:     Head: Normocephalic and atraumatic.  Eyes:     Pupils: Pupils are equal, round, and reactive to light.  Cardiovascular:     Rate and Rhythm: Normal rate and regular rhythm.     Heart sounds: Normal heart sounds. No murmur heard. Pulmonary:     Effort: Pulmonary effort is normal. No respiratory distress.     Breath sounds: Normal breath sounds. No wheezing.  Neurological:     Mental Status: She is alert and oriented to person, place, and time.  Psychiatric:        Mood and Affect: Mood normal.        Behavior: Behavior normal.        Assessment/Plan: 1. Centrilobular emphysema (HCC) Moderate to severe emphysema, continue Trelegy inhaler as prescribed.  Repeat CT chest in 6 months - CT CHEST LCS NODULE F/U LOW DOSE WO CONTRAST; Future  2. Mass of left lung Repeat CT chest in 6 months - CT CHEST LCS NODULE F/U LOW DOSE WO CONTRAST; Future  3. Cigarette nicotine dependence with nicotine-induced disorder Declined smoking cessation counseling.  Is aware of the benefits of quitting smoking.  Will address at next visit again.  Repeat CT chest in 6 months for lung cancer screening and follow-up - CT CHEST LCS NODULE F/U LOW DOSE WO CONTRAST; Future   General Counseling: kensie susman understanding of the findings of todays visit and agrees with plan of treatment. I have discussed any further diagnostic evaluation that may be needed or ordered today. We also reviewed her medications today. she has been encouraged to call the office with any questions or concerns that should arise related to todays visit.    Orders Placed This Encounter  Procedures   CT CHEST LCS NODULE F/U LOW DOSE WO CONTRAST    No orders of the defined types were  placed in this encounter.   Return in 5 months (on 04/18/2022) for previously scheduled, F/U with Stevee Valenta discuss pulm and repeat CT chest.   Total time spent:30 Minutes Time spent includes review of chart, medications, test results, and follow up plan with the patient.   [place] Controlled Substance Database was reviewed by me.  This patient was seen by Jonetta Osgood, FNP-C in collaboration with Dr. Clayborn Bigness as a part of collaborative care agreement.   Rebbecca Osuna R. Valetta Fuller, MSN, FNP-C Internal medicine

## 2022-04-05 ENCOUNTER — Ambulatory Visit: Payer: Medicare HMO | Admitting: Internal Medicine

## 2022-04-05 ENCOUNTER — Telehealth: Payer: Self-pay | Admitting: Internal Medicine

## 2022-04-05 NOTE — Telephone Encounter (Signed)
Lvm to verify current insurance to obtain benefits for [name]

## 2022-04-06 ENCOUNTER — Telehealth: Payer: Self-pay | Admitting: Internal Medicine

## 2022-04-06 NOTE — Telephone Encounter (Signed)
Lvm and sent mychart msg to verify if dsk still her doctor-Toni

## 2022-04-18 ENCOUNTER — Ambulatory Visit: Payer: Medicare HMO | Admitting: Nurse Practitioner

## 2022-08-10 ENCOUNTER — Telehealth: Payer: Self-pay | Admitting: Nurse Practitioner

## 2022-08-10 NOTE — Telephone Encounter (Addendum)
Lvm with spouse and sent message to patient to return my call regarding CT-Toni Patient sent message back stating to cancel any upcoming tests and appointments. She will no longer being needing our services-Toni

## 2022-10-03 IMAGING — CT CT CHEST LUNG CANCER SCREENING LOW DOSE W/O CM
2 of 5 series · 15 of 40 positions shown, 18 images · non-contrast
Comparison: No priors.

CLINICAL DATA: 64-year-old female current smoker with 35 pack-year
history of smoking. Lung cancer screening examination.

EXAM:
CT CHEST WITHOUT CONTRAST LOW-DOSE FOR LUNG CANCER SCREENING
TECHNIQUE: Multidetector CT imaging of the chest was performed following the
standard protocol without IV contrast.

[Series 3: lung 1.00 · axial · 0.58mm/px · z∈[-1218,-914]mm · 12 of 337 slices shown, 15 images]
[im 17/337  mediastinal]
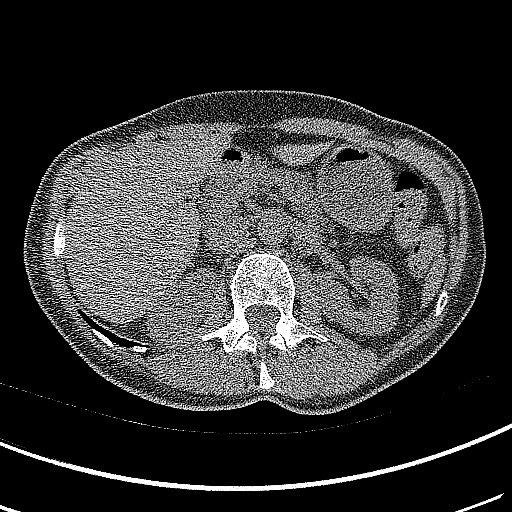
[im 17/337  lung]
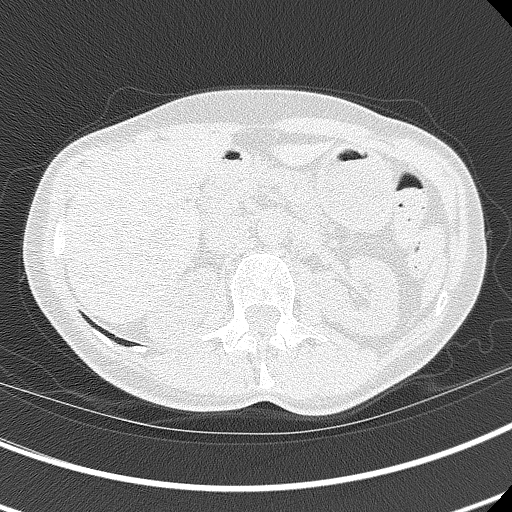
[im 49/337  lung]
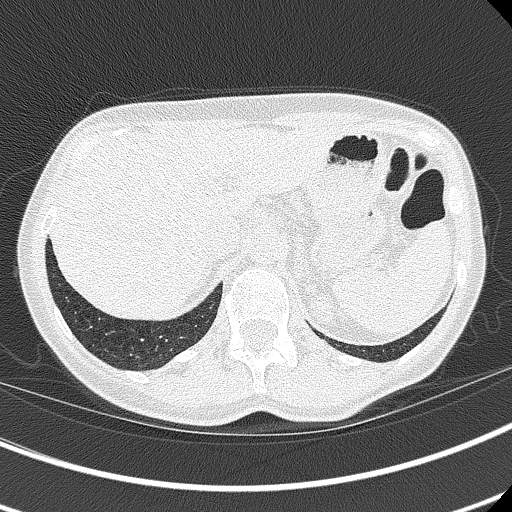
[im 81/337  lung]
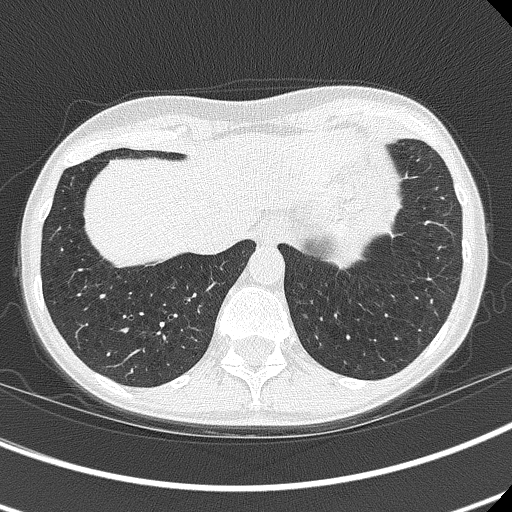
[im 97/337  lung]
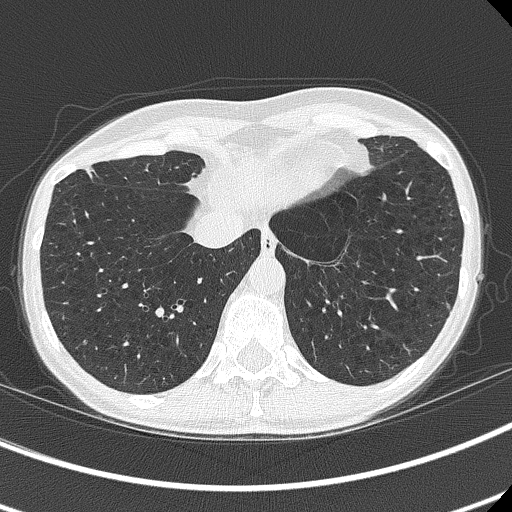
[im 129/337  mediastinal]
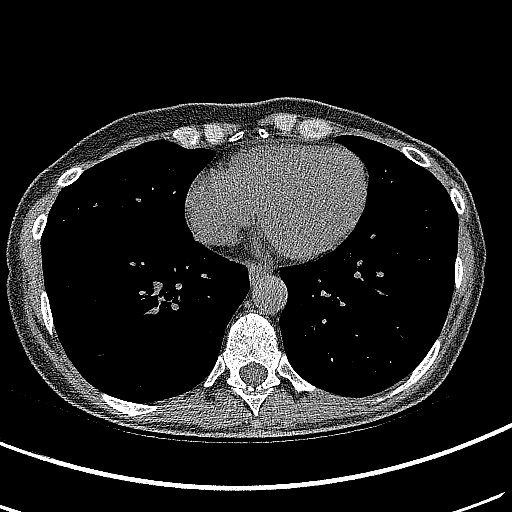
[im 129/337  lung]
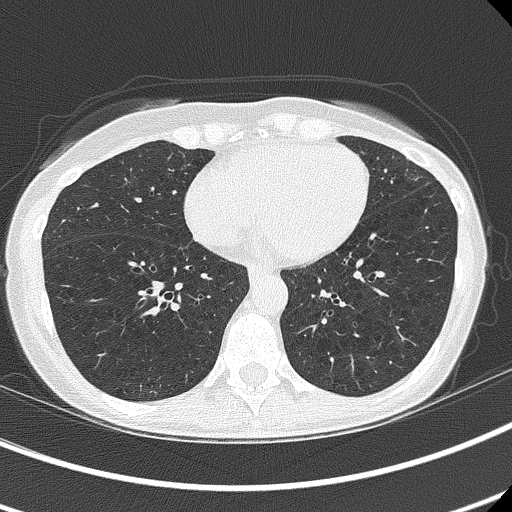
[im 161/337  lung]
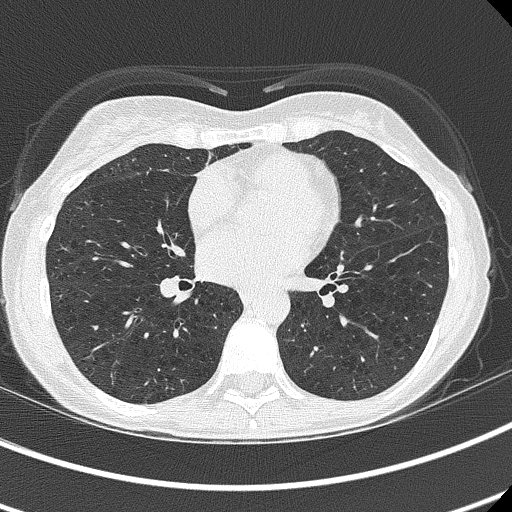
[im 177/337  lung]
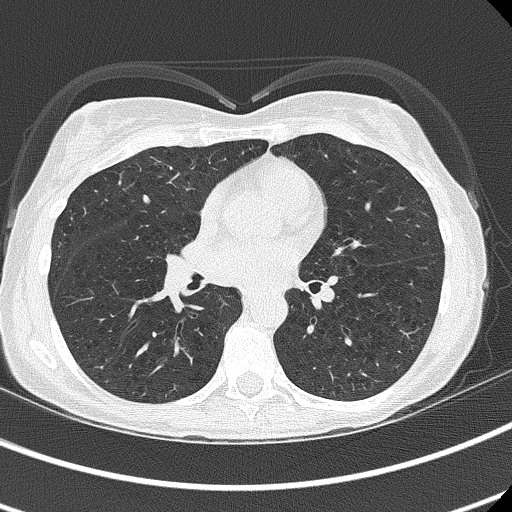
[im 209/337  lung]
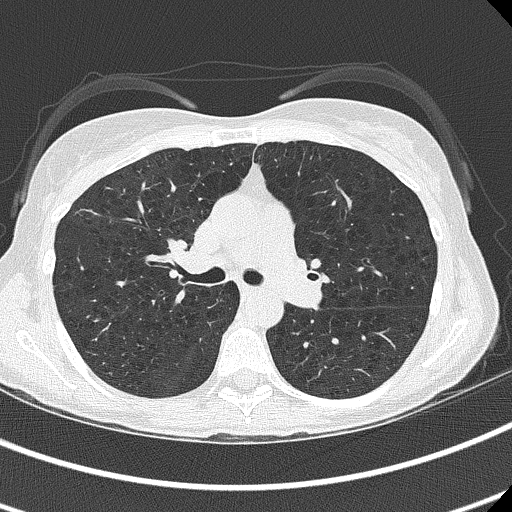
[im 241/337  mediastinal]
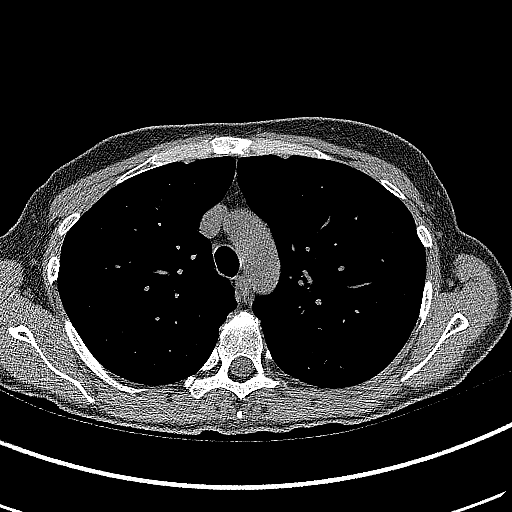
[im 241/337  lung]
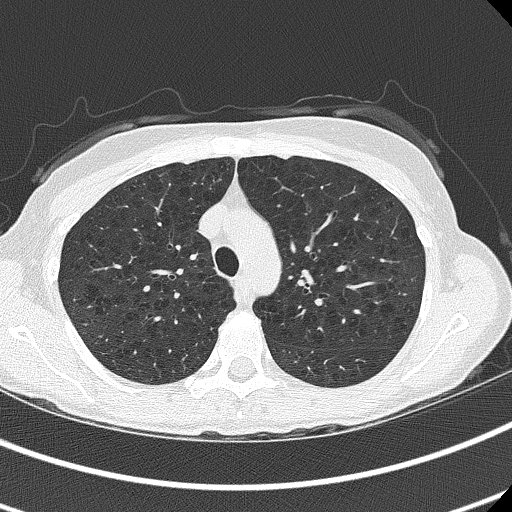
[im 257/337  lung]
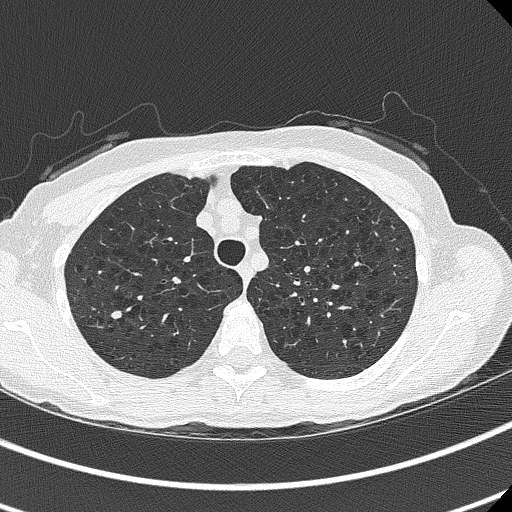
[im 289/337  lung]
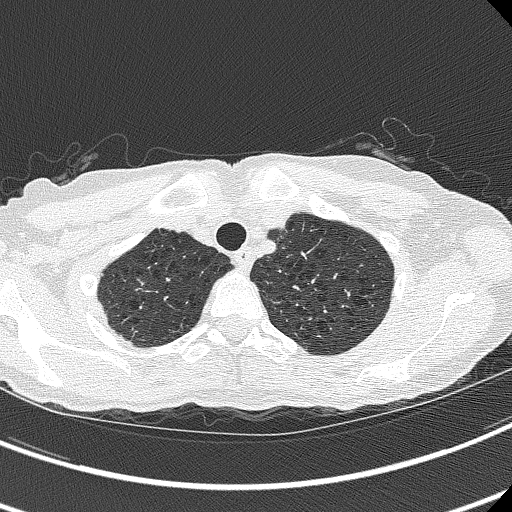
[im 321/337  lung]
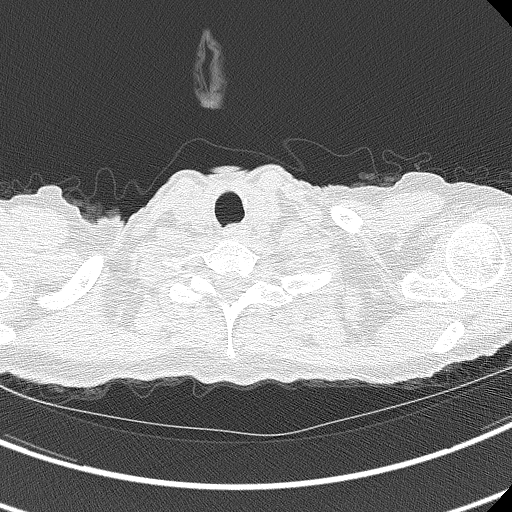

[Series 4: coronals lung 1.00 cor · coronal · 0.58mm/px · 3 of 198 slices shown]
[im 40/198  lung]
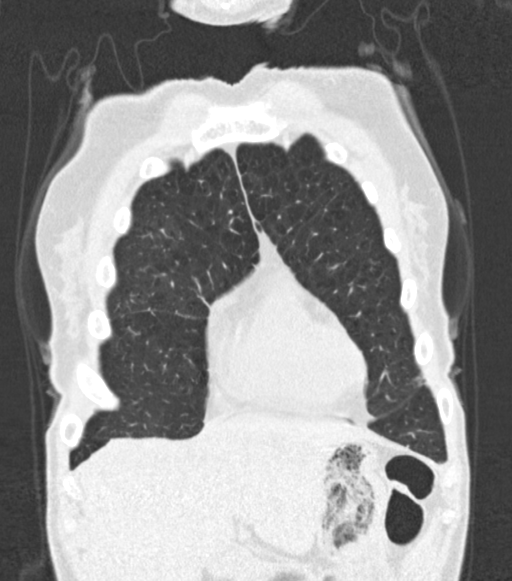
[im 79/198  lung]
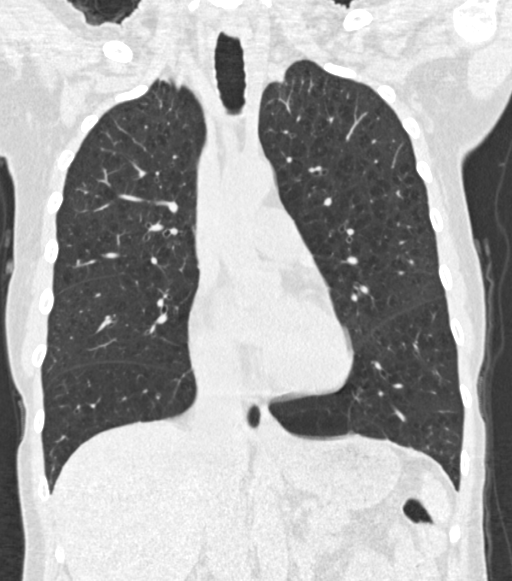
[im 119/198  lung]
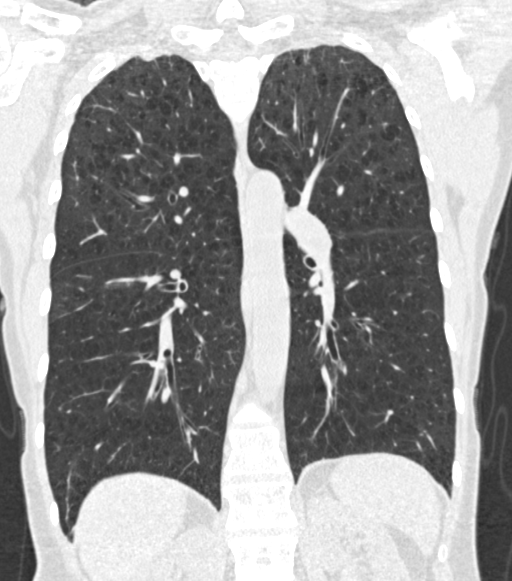

[15 of 40 positions shown; findings below may reference images not displayed]

FINDINGS: Cardiovascular: Heart size is normal. There is no significant
pericardial fluid, thickening or pericardial calcification. Aortic
atherosclerosis. No definite coronary artery calcifications.

Mediastinum/Nodes: No pathologically enlarged mediastinal or hilar
lymph nodes. Please note that accurate exclusion of hilar adenopathy
is limited on noncontrast CT scans. Esophagus is unremarkable in
appearance. No axillary lymphadenopathy.

Lungs/Pleura: Multiple pulmonary nodules are noted throughout both
lungs, largest of which is in the medial aspect of the right upper
lobe (axial image 69 of series 3), with a volume derived mean
diameter of 6.5 mm. No other larger more suspicious appearing
pulmonary nodules or masses are noted. No acute consolidative
airspace disease. No pleural effusions. Diffuse bronchial wall
thickening with mild centrilobular and paraseptal emphysema.

Upper Abdomen: [DATE] x 1.3 cm intermediate attenuation lesion in
segment 2 of the liver (axial image 54 of series 2), incompletely
characterized on today's noncontrast CT examination.

Musculoskeletal: There are no aggressive appearing lytic or blastic
lesions noted in the visualized portions of the skeleton.
IMPRESSION: 1. Lung-RADS 3, probably benign findings. Short-term follow-up in 6
months is recommended with repeat low-dose chest CT without contrast
(please use the following order, "CT CHEST LCS NODULE FOLLOW-UP W/O
CM").
2. Aortic atherosclerosis.
3. Mild diffuse bronchial wall thickening with mild centrilobular
and paraseptal emphysema; imaging findings suggestive of underlying
COPD.
4. Intermediate attenuation lesion measuring 1.9 x 1.3 cm in segment
2 of the liver, incompletely characterized on today's noncontrast CT
examination. Further evaluation with nonemergent abdominal MRI with
and without IV gadolinium is recommended in the near future to
provide definitive characterization.

These results will be called to the ordering clinician or
representative by the Radiologist Assistant, and communication
documented in the PACS or [REDACTED].

Aortic Atherosclerosis (UG3V3-5H0.0) and Emphysema ([id number].4).

## 2022-12-19 DIAGNOSIS — I6523 Occlusion and stenosis of bilateral carotid arteries: Secondary | ICD-10-CM | POA: Diagnosis not present

## 2022-12-19 DIAGNOSIS — I10 Essential (primary) hypertension: Secondary | ICD-10-CM | POA: Diagnosis not present

## 2022-12-19 DIAGNOSIS — F17210 Nicotine dependence, cigarettes, uncomplicated: Secondary | ICD-10-CM | POA: Diagnosis not present

## 2022-12-19 DIAGNOSIS — I2585 Chronic coronary microvascular dysfunction: Secondary | ICD-10-CM | POA: Diagnosis not present

## 2022-12-19 DIAGNOSIS — R0989 Other specified symptoms and signs involving the circulatory and respiratory systems: Secondary | ICD-10-CM | POA: Diagnosis not present

## 2022-12-19 DIAGNOSIS — G608 Other hereditary and idiopathic neuropathies: Secondary | ICD-10-CM | POA: Diagnosis not present

## 2022-12-19 DIAGNOSIS — E559 Vitamin D deficiency, unspecified: Secondary | ICD-10-CM | POA: Diagnosis not present

## 2022-12-19 DIAGNOSIS — E0510 Thyrotoxicosis with toxic single thyroid nodule without thyrotoxic crisis or storm: Secondary | ICD-10-CM | POA: Diagnosis not present

## 2022-12-19 DIAGNOSIS — E785 Hyperlipidemia, unspecified: Secondary | ICD-10-CM | POA: Diagnosis not present

## 2023-01-01 DIAGNOSIS — J029 Acute pharyngitis, unspecified: Secondary | ICD-10-CM | POA: Diagnosis not present

## 2023-01-01 DIAGNOSIS — R5383 Other fatigue: Secondary | ICD-10-CM | POA: Diagnosis not present

## 2023-01-01 DIAGNOSIS — J069 Acute upper respiratory infection, unspecified: Secondary | ICD-10-CM | POA: Diagnosis not present

## 2023-01-01 DIAGNOSIS — R051 Acute cough: Secondary | ICD-10-CM | POA: Diagnosis not present

## 2023-01-01 DIAGNOSIS — Z1152 Encounter for screening for COVID-19: Secondary | ICD-10-CM | POA: Diagnosis not present

## 2023-01-15 DIAGNOSIS — I2584 Coronary atherosclerosis due to calcified coronary lesion: Secondary | ICD-10-CM | POA: Diagnosis not present

## 2023-01-15 DIAGNOSIS — R0609 Other forms of dyspnea: Secondary | ICD-10-CM | POA: Diagnosis not present

## 2023-01-15 DIAGNOSIS — E782 Mixed hyperlipidemia: Secondary | ICD-10-CM | POA: Diagnosis not present

## 2023-01-15 DIAGNOSIS — R0789 Other chest pain: Secondary | ICD-10-CM | POA: Diagnosis not present

## 2023-01-15 DIAGNOSIS — I2510 Atherosclerotic heart disease of native coronary artery without angina pectoris: Secondary | ICD-10-CM | POA: Diagnosis not present

## 2023-01-15 DIAGNOSIS — J432 Centrilobular emphysema: Secondary | ICD-10-CM | POA: Diagnosis not present

## 2023-01-15 DIAGNOSIS — I10 Essential (primary) hypertension: Secondary | ICD-10-CM | POA: Diagnosis not present

## 2023-01-18 DIAGNOSIS — Z1231 Encounter for screening mammogram for malignant neoplasm of breast: Secondary | ICD-10-CM | POA: Diagnosis not present

## 2023-01-19 DIAGNOSIS — J069 Acute upper respiratory infection, unspecified: Secondary | ICD-10-CM | POA: Diagnosis not present

## 2023-01-19 DIAGNOSIS — J209 Acute bronchitis, unspecified: Secondary | ICD-10-CM | POA: Diagnosis not present

## 2023-01-19 DIAGNOSIS — R5383 Other fatigue: Secondary | ICD-10-CM | POA: Diagnosis not present

## 2023-01-19 DIAGNOSIS — R051 Acute cough: Secondary | ICD-10-CM | POA: Diagnosis not present

## 2023-01-25 ENCOUNTER — Telehealth: Payer: Self-pay | Admitting: Internal Medicine

## 2023-01-25 NOTE — Telephone Encounter (Signed)
Per request, chest CT results faxed to patient's pcp, [facility];[phone]

## 2023-01-31 ENCOUNTER — Other Ambulatory Visit: Payer: Self-pay | Admitting: Family Medicine

## 2023-01-31 DIAGNOSIS — F17210 Nicotine dependence, cigarettes, uncomplicated: Secondary | ICD-10-CM

## 2023-01-31 DIAGNOSIS — Z122 Encounter for screening for malignant neoplasm of respiratory organs: Secondary | ICD-10-CM
